# Patient Record
Sex: Male | Born: 1955 | Race: White | Hispanic: No | Smoking: Never smoker
Health system: Southern US, Community
[De-identification: ages and names within clinical notes are randomized; demographics above are authoritative.]

## PROBLEM LIST (undated history)

## (undated) DIAGNOSIS — I10 Essential (primary) hypertension: Secondary | ICD-10-CM

## (undated) HISTORY — PX: OTHER SURGICAL HISTORY: SHX169

## (undated) HISTORY — PX: TONSILLECTOMY: SUR1361

---

## 2013-10-08 HISTORY — PX: INCISION AND DRAINAGE: SHX5863

## 2014-04-12 ENCOUNTER — Other Ambulatory Visit: Payer: Self-pay | Admitting: Orthopedic Surgery

## 2014-04-12 DIAGNOSIS — M546 Pain in thoracic spine: Secondary | ICD-10-CM

## 2014-04-14 ENCOUNTER — Other Ambulatory Visit: Payer: Self-pay | Admitting: Surgical

## 2014-04-14 ENCOUNTER — Ambulatory Visit
Admission: RE | Admit: 2014-04-14 | Discharge: 2014-04-14 | Disposition: A | Discharge status: Home / Self Care | Payer: BC Managed Care – PPO | Source: Ambulatory Visit | Attending: Orthopedic Surgery | Admitting: Orthopedic Surgery

## 2014-04-14 ENCOUNTER — Emergency Department (HOSPITAL_COMMUNITY): Payer: BC Managed Care – PPO

## 2014-04-14 ENCOUNTER — Other Ambulatory Visit: Payer: Self-pay | Admitting: Orthopedic Surgery

## 2014-04-14 ENCOUNTER — Inpatient Hospital Stay (HOSPITAL_COMMUNITY)
Admission: EM | Admit: 2014-04-14 | Discharge: 2014-04-21 | DRG: 603 | Disposition: A | Discharge status: Home / Self Care | Payer: BC Managed Care – PPO | Attending: Orthopedic Surgery | Admitting: Orthopedic Surgery

## 2014-04-14 ENCOUNTER — Encounter (HOSPITAL_COMMUNITY): Payer: Self-pay | Admitting: Emergency Medicine

## 2014-04-14 DIAGNOSIS — M79674 Pain in right toe(s): Secondary | ICD-10-CM

## 2014-04-14 DIAGNOSIS — M79659 Pain in unspecified thigh: Secondary | ICD-10-CM

## 2014-04-14 DIAGNOSIS — L03119 Cellulitis of unspecified part of limb: Principal | ICD-10-CM

## 2014-04-14 DIAGNOSIS — Z113 Encounter for screening for infections with a predominantly sexual mode of transmission: Secondary | ICD-10-CM

## 2014-04-14 DIAGNOSIS — L02415 Cutaneous abscess of right lower limb: Secondary | ICD-10-CM | POA: Diagnosis not present

## 2014-04-14 DIAGNOSIS — L02419 Cutaneous abscess of limb, unspecified: Principal | ICD-10-CM | POA: Diagnosis present

## 2014-04-14 DIAGNOSIS — A4902 Methicillin resistant Staphylococcus aureus infection, unspecified site: Secondary | ICD-10-CM | POA: Diagnosis present

## 2014-04-14 DIAGNOSIS — Z6831 Body mass index (BMI) 31.0-31.9, adult: Secondary | ICD-10-CM

## 2014-04-14 DIAGNOSIS — Z79899 Other long term (current) drug therapy: Secondary | ICD-10-CM

## 2014-04-14 DIAGNOSIS — K5900 Constipation, unspecified: Secondary | ICD-10-CM | POA: Diagnosis present

## 2014-04-14 DIAGNOSIS — L02416 Cutaneous abscess of left lower limb: Secondary | ICD-10-CM | POA: Diagnosis present

## 2014-04-14 DIAGNOSIS — Z7982 Long term (current) use of aspirin: Secondary | ICD-10-CM

## 2014-04-14 DIAGNOSIS — M60009 Infective myositis, unspecified site: Secondary | ICD-10-CM

## 2014-04-14 DIAGNOSIS — M546 Pain in thoracic spine: Secondary | ICD-10-CM

## 2014-04-14 LAB — CBC WITH DIFFERENTIAL/PLATELET
Basophils Absolute: 0 10*3/uL (ref 0.0–0.1)
Basophils Relative: 0 % (ref 0–1)
Eosinophils Absolute: 0.2 10*3/uL (ref 0.0–0.7)
Eosinophils Relative: 2 % (ref 0–5)
HCT: 40.8 % (ref 39.0–52.0)
Hemoglobin: 14.1 g/dL (ref 13.0–17.0)
Lymphocytes Relative: 12 % (ref 12–46)
Lymphs Abs: 1.4 10*3/uL (ref 0.7–4.0)
MCH: 33.1 pg (ref 26.0–34.0)
MCHC: 34.6 g/dL (ref 30.0–36.0)
MCV: 95.8 fL (ref 78.0–100.0)
Monocytes Absolute: 1.1 10*3/uL — ABNORMAL HIGH (ref 0.1–1.0)
Monocytes Relative: 10 % (ref 3–12)
Neutro Abs: 8.7 10*3/uL — ABNORMAL HIGH (ref 1.7–7.7)
Neutrophils Relative %: 76 % (ref 43–77)
Platelets: 308 10*3/uL (ref 150–400)
RBC: 4.26 MIL/uL (ref 4.22–5.81)
RDW: 14.1 % (ref 11.5–15.5)
WBC: 11.4 10*3/uL — ABNORMAL HIGH (ref 4.0–10.5)

## 2014-04-14 LAB — BASIC METABOLIC PANEL
Anion gap: 13 (ref 5–15)
BUN: 17 mg/dL (ref 6–23)
CO2: 27 mEq/L (ref 19–32)
Calcium: 8.9 mg/dL (ref 8.4–10.5)
Chloride: 92 mEq/L — ABNORMAL LOW (ref 96–112)
Creatinine, Ser: 0.85 mg/dL (ref 0.50–1.35)
GFR calc Af Amer: >90 mL/min (ref >90)
GFR calc non Af Amer: >90 mL/min (ref >90)
Glucose, Bld: 110 mg/dL — ABNORMAL HIGH (ref 70–99)
Potassium: 4 mEq/L (ref 3.7–5.3)
Sodium: 132 mEq/L — ABNORMAL LOW (ref 137–147)

## 2014-04-14 LAB — SURGICAL PCR SCREEN
MRSA, PCR: POSITIVE — AB
STAPHYLOCOCCUS AUREUS: POSITIVE — AB

## 2014-04-14 LAB — SEDIMENTATION RATE: Sed Rate: 65 mm/hr — ABNORMAL HIGH (ref 0–16)

## 2014-04-14 LAB — APTT: aPTT: 24 seconds (ref 24–37)

## 2014-04-14 LAB — PROTIME-INR
INR: 1.15 (ref 0.00–1.49)
Prothrombin Time: 14.7 seconds (ref 11.6–15.2)

## 2014-04-14 MED ORDER — HYDROMORPHONE HCL 2 MG PO TABS
4.0000 mg | ORAL_TABLET | ORAL | Status: DC | PRN
  Administered 2014-04-14 – 2014-04-17 (×9): 4 mg via ORAL
  Administered 2014-04-17: 2 mg via ORAL
  Administered 2014-04-18 – 2014-04-19 (×7): 4 mg via ORAL
  Administered 2014-04-20: 2 mg via ORAL
  Administered 2014-04-20 (×2): 4 mg via ORAL
  Administered 2014-04-21: 2 mg via ORAL
  Filled 2014-04-14 (×22): qty 2

## 2014-04-14 MED ORDER — ONDANSETRON HCL 4 MG/2ML IJ SOLN: 4.0000 mg | Freq: Four times a day (QID) | INTRAMUSCULAR | Status: DC | PRN

## 2014-04-14 MED ORDER — MORPHINE SULFATE 2 MG/ML IJ SOLN
1.0000 mg | INTRAMUSCULAR | Status: DC | PRN
  Administered 2014-04-14 – 2014-04-15 (×3): 2 mg via INTRAVENOUS
  Filled 2014-04-14 (×3): qty 1

## 2014-04-14 MED ORDER — SODIUM CHLORIDE 0.9 % IJ SOLN
3.0000 mL | Freq: Two times a day (BID) | INTRAMUSCULAR | Status: DC
  Administered 2014-04-16 – 2014-04-20 (×4): 3 mL via INTRAVENOUS

## 2014-04-14 MED ORDER — CHLORHEXIDINE GLUCONATE CLOTH 2 % EX PADS
6.0000 | MEDICATED_PAD | Freq: Every day | CUTANEOUS | Status: AC
  Administered 2014-04-15 – 2014-04-19 (×5): 6 via TOPICAL

## 2014-04-14 MED ORDER — ALUM & MAG HYDROXIDE-SIMETH 200-200-20 MG/5ML PO SUSP
30.0000 mL | Freq: Four times a day (QID) | ORAL | Status: DC | PRN
  Filled 2014-04-14: qty 30

## 2014-04-14 MED ORDER — CYCLOBENZAPRINE HCL 10 MG PO TABS
10.0000 mg | ORAL_TABLET | Freq: Three times a day (TID) | ORAL | Status: DC | PRN
  Administered 2014-04-14 – 2014-04-20 (×11): 10 mg via ORAL
  Filled 2014-04-14 (×11): qty 1

## 2014-04-14 MED ORDER — SODIUM CHLORIDE 0.9 % IV SOLN: 250.0000 mL | INTRAVENOUS | Status: DC | PRN

## 2014-04-14 MED ORDER — PREDNISONE 5 MG PO TABS: 5.0000 mg | ORAL_TABLET | Freq: Every day | ORAL | Status: DC

## 2014-04-14 MED ORDER — PREDNISONE 5 MG PO TABS
15.0000 mg | ORAL_TABLET | Freq: Every day | ORAL | Status: AC
  Administered 2014-04-15 – 2014-04-16 (×2): 15 mg via ORAL
  Filled 2014-04-14 (×2): qty 1

## 2014-04-14 MED ORDER — DOCUSATE SODIUM 100 MG PO CAPS
100.0000 mg | ORAL_CAPSULE | Freq: Two times a day (BID) | ORAL | Status: DC
Start: 2014-04-14 — End: 2014-04-21
  Administered 2014-04-15 – 2014-04-21 (×12): 100 mg via ORAL

## 2014-04-14 MED ORDER — POLYETHYLENE GLYCOL 3350 17 G PO PACK
17.0000 g | PACK | Freq: Every day | ORAL | Status: DC | PRN
  Administered 2014-04-16 – 2014-04-17 (×2): 17 g via ORAL

## 2014-04-14 MED ORDER — ONDANSETRON HCL 4 MG PO TABS: 4.0000 mg | ORAL_TABLET | Freq: Four times a day (QID) | ORAL | Status: DC | PRN

## 2014-04-14 MED ORDER — SODIUM CHLORIDE 0.9 % IJ SOLN: 3.0000 mL | INTRAMUSCULAR | Status: DC | PRN

## 2014-04-14 MED ORDER — MUPIROCIN 2 % EX OINT
1.0000 "application " | TOPICAL_OINTMENT | Freq: Two times a day (BID) | CUTANEOUS | Status: AC
  Administered 2014-04-15 – 2014-04-19 (×9): 1 via NASAL
  Filled 2014-04-14: qty 22

## 2014-04-14 MED ORDER — BISACODYL 10 MG RE SUPP
10.0000 mg | Freq: Every day | RECTAL | Status: DC | PRN
  Administered 2014-04-18: 10 mg via RECTAL
  Filled 2014-04-14: qty 1

## 2014-04-14 MED ORDER — ACETAMINOPHEN 650 MG RE SUPP: 650.0000 mg | Freq: Four times a day (QID) | RECTAL | Status: DC | PRN

## 2014-04-14 MED ORDER — PREDNISONE 10 MG PO TABS
10.0000 mg | ORAL_TABLET | Freq: Every day | ORAL | Status: DC
  Administered 2014-04-17: 10 mg via ORAL
  Filled 2014-04-14 (×2): qty 1

## 2014-04-14 MED ORDER — IOHEXOL 300 MG/ML  SOLN
125.0000 mL | Freq: Once | INTRAMUSCULAR | Status: AC | PRN
  Administered 2014-04-14: 125 mL via INTRAVENOUS

## 2014-04-14 MED ORDER — ACETAMINOPHEN 325 MG PO TABS: 650.0000 mg | ORAL_TABLET | Freq: Four times a day (QID) | ORAL | Status: DC | PRN

## 2014-04-14 MED ORDER — PREDNISONE 5 MG PO TABS: 5.0000 mg | ORAL_TABLET | ORAL | Status: DC

## 2014-04-14 MED ORDER — LACTATED RINGERS IV SOLN
INTRAVENOUS | Status: DC
  Administered 2014-04-14: 75 mL/h via INTRAVENOUS
  Administered 2014-04-15 – 2014-04-17 (×4): via INTRAVENOUS
  Administered 2014-04-18: 1000 mL via INTRAVENOUS
  Administered 2014-04-20: 07:00:00 via INTRAVENOUS

## 2014-04-14 MED ORDER — FLEET ENEMA 7-19 GM/118ML RE ENEM: 1.0000 | ENEMA | Freq: Once | RECTAL | Status: AC | PRN

## 2014-04-14 NOTE — Progress Notes (Signed)
Subjective: Pain and swelling of Right Great Toe.   Objective: Vital signs in last 24 hours: Temp:  [98.3 F (36.8 C)] 98.3 F (36.8 C) (07/08 1507) Pulse Rate:  [88] 88 (07/08 1507) Resp:  [16] 16 (07/08 1507) BP: (122)/(82) 122/82 mmHg (07/08 1507) SpO2:  [95 %] 95 % (07/08 1507)  Intake/Output from previous day:   Intake/Output this shift:    No results found for this basename: HGB,  in the last 72 hours No results found for this basename: WBC, RBC, HCT, PLT,  in the last 72 hours No results found for this basename: NA, K, CL, CO2, BUN, CREATININE, GLUCOSE, CALCIUM,  in the last 72 hours No results found for this basename: LABPT, INR,  in the last 72 hours  Pain and swelling of his Right Great toe.  Assessment/Plan: Admit to hospital for Aspiration of his Left Thigh.   Atalie Oros A 04/14/2014, 4:45 PM

## 2014-04-14 NOTE — ED Notes (Signed)
Initial contact - pt reports told to come to ER after imaging completed this AM.  C/o pain to RLE.  +swelling/reddness noted.  Skin otherwise PWD.  MAEI.  A+Ox4.  Speaking full/clear sentences.  NAD.

## 2014-04-14 NOTE — ED Notes (Signed)
Surgery at bedside for I&D of toe swelling.

## 2014-04-14 NOTE — ED Notes (Signed)
Attempted to call report to floor, RN unavailable at this time.  

## 2014-04-14 NOTE — ED Notes (Signed)
Pt to radiology.

## 2014-04-14 NOTE — H&P (Signed)
Nicholas [name] is an 58 y.o. male.   Chief Complaint: Pain over inner Left Thigh HPI: He ride a bike 30 miles a day and about one week ago he noted pain in his Left Thigh which has become progressively worse. He has never had a fever. Hih=s Lumbar and Thoracic [name] MRIs have not explained his problem. I ordered a CT Scan of his Pelvis and Report showed a Fluid collection in his Adductor Muscle.No pain in his Left Hip. He also has a peculiar appearance of his opposite Right Great Toe.  History reviewed. No pertinent past medical history.  Past Surgical History  Procedure Laterality Date  . Tonsillectomy      History reviewed. No pertinent family history. Social History:  reports that he has never smoked. He does not have any smokeless tobacco history on file. He reports that he drinks alcohol. He reports that he does not use illicit drugs.  Allergies:  Allergies  Allergen Reactions  . Penicillins     "childhood reaction"     (Not in a hospital admission)  No results found for this or any previous visit (from the past 48 hour(s)). Ct Pelvis W Contrast  04/14/2014   ADDENDUM REPORT: 04/14/2014 13:56  ADDENDUM: Critical Value/emergent results were called by telephone at the time of interpretation on 04/14/2014 at 1:56 PM to Nicholas [name] , who verbally acknowledged these results.   Electronically Signed   By: Nicholas [name] M.D.   On: 04/14/2014 13:56   04/14/2014   CLINICAL DATA:  Thigh pain.  EXAM: CT PELVIS WITH CONTRAST  TECHNIQUE: Multidetector CT imaging of the pelvis was performed using the standard protocol following the bolus administration of intravenous contrast.  CONTRAST:  163mL OMNIPAQUE IOHEXOL 300 MG/ML  SOLN  COMPARISON:  None.  FINDINGS: No significant osseous abnormality is noted. Urinary bladder appears normal. No intraperitoneal fluid collection is noted. Visualized portion of the appendix appears normal. 12.9 x 5.2 x 3.9 cm peripherally enhancing fluid collection is noted  beginning medially and along the course of the left adductor longus muscle. Small amount of gas is noted in the superior portion of this abnormality. Stranding of the overlying subcutaneous tissues is noted concerning for possible cellulitis or edema. Mildly enlarged lymph nodes are noted in the left external iliac region, all of which are less than 1 cm in minor axis. These most likely are inflammatory in origin.  IMPRESSION: Peripherally enhancing fluid collection measuring 12.9 x 5.2 x 3.9 cm is noted along the medial portion of the left adductor longus muscle in the proximal left thigh and inguinal region. The contain soft tissue gas and has stranding of the overlying subcutaneous tissues and is concerning for probable abscess. However, hematoma cannot be excluded but felt much less likely. Probable mild inflammatory adenopathy seen in left external iliac region. I contacted Nicholas [name] office, but he is currently in surgery and they will contact him. These results will be called to the ordering clinician or representative by the Radiologist Assistant, and communication documented in the PACS or zVision Dashboard.  Electronically Signed: By: Nicholas [name] M.D. On: 04/14/2014 13:47   Nicholas [name] Wo Contrast  04/14/2014   CLINICAL DATA:  Bilateral leg pain  EXAM: MRI THORACIC [name] WITHOUT CONTRAST  TECHNIQUE: Multiplanar, multisequence Nicholas imaging of the thoracic [name] was performed. No intravenous contrast was administered.  COMPARISON:  None.  FINDINGS: The vertebral body heights are maintained. The alignment is anatomic. The marrow signal is normal. There is  no acute fracture or static listhesis. The thoracic spinal cord is normal in size and signal.  The disc spaces are relatively well maintained. There is no central canal or foraminal stenosis. There is a mild broad-based disc bulge at T2-3. There is a minimal broad-based disc bulge at T3-4. There is a small right paracentral disc protrusion T7-8  with mild deformity of the right ventral paracentral thoracic spinal cord. There is a small left paracentral disc protrusion at T8-9. There is a right paracentral small disc protrusion T9-10 abutting the ventral thoracic spinal cord. There is a small right paracentral disc protrusion T11-12 abutting the ventral cervical spinal cord.  IMPRESSION: 1. Small right paracentral disc protrusion T7-8 with mild deformity of the right ventral paracentral thoracic spinal cord. 2. Small left paracentral disc protrusion at T8-9. 3. Right paracentral small disc protrusion T9-10 abutting the ventral thoracic spinal cord. 4. Small right paracentral disc protrusion T11-12 abutting the ventral cervical spinal cord.   Electronically Signed   By: Nicholas [name]   On: 04/14/2014 09:59    Review of Systems  Constitutional: Negative.   HENT: Negative.   Eyes: Negative.   Respiratory: Negative.   Cardiovascular: Negative.   Gastrointestinal: Negative.   Genitourinary: Negative.   Musculoskeletal:       Pain and swelling along the Left Adductor Muscle group.No pain with hip motion.  Skin:       Erythemia Left Thigh  Neurological: Negative.   Endo/Heme/Allergies: Negative.   Psychiatric/Behavioral: Negative.     Blood pressure 122/82, pulse 88, temperature 98.3 F (36.8 C), temperature source Oral, resp. rate 16, SpO2 95.00%. Physical Exam  Constitutional: He appears well-developed.  HENT:  Head: Normocephalic.  Eyes: Pupils are equal, round, and reactive to light.  Neck: Normal range of motion.  Cardiovascular: Normal rate.   Respiratory: Effort normal.  GI: Soft.  Genitourinary: Penis normal.  Musculoskeletal:  Pain over Left Adductor muscle group with Erythema.  Skin: Skin is warm. There is erythema.     Assessment/Plan Xray Right Foot . Admit for Aspiration of Adductor Muscle by Radiologist and evaluation by Infectious Disease.  Nicholas [name] A 04/14/2014, 4:02 PM

## 2014-04-14 NOTE — ED Provider Notes (Signed)
1600 - Patient here under care of Dr. Gladstone Lighter. Here for L upper leg cellulitis/fluid collection, R toe swelling. Dr. Gladstone Lighter admitting.  Osvaldo Shipper, MD 04/14/14 [phone]

## 2014-04-14 NOTE — ED Notes (Addendum)
Pt, sent by [facility] MD, c/o L upper leg pain x 9 days and R great toe pain/possible infection x 2 days.  Pain score 4/10.  Redness, swelling, and warmth noted to both areas.  Pt had MRI and CT earlier and was told that he has an abscess or hematoma on L side pelvis that needs to be aspirated.

## 2014-04-14 NOTE — ED Notes (Signed)
Surgery at bedside for eval

## 2014-04-15 ENCOUNTER — Encounter (HOSPITAL_COMMUNITY): Payer: Self-pay | Admitting: *Deleted

## 2014-04-15 ENCOUNTER — Observation Stay (HOSPITAL_COMMUNITY): Payer: BC Managed Care – PPO

## 2014-04-15 DIAGNOSIS — L03119 Cellulitis of unspecified part of limb: Secondary | ICD-10-CM

## 2014-04-15 DIAGNOSIS — L02419 Cutaneous abscess of limb, unspecified: Secondary | ICD-10-CM

## 2014-04-15 LAB — GRAM STAIN

## 2014-04-15 LAB — CBC WITH DIFFERENTIAL/PLATELET
Basophils Absolute: 0 10*3/uL (ref 0.0–0.1)
Basophils Relative: 0 % (ref 0–1)
Eosinophils Absolute: 0.2 10*3/uL (ref 0.0–0.7)
Eosinophils Relative: 2 % (ref 0–5)
HCT: 39.8 % (ref 39.0–52.0)
Hemoglobin: 13.5 g/dL (ref 13.0–17.0)
Lymphocytes Relative: 10 % — ABNORMAL LOW (ref 12–46)
Lymphs Abs: 1.1 10*3/uL (ref 0.7–4.0)
MCH: 32.8 pg (ref 26.0–34.0)
MCHC: 33.9 g/dL (ref 30.0–36.0)
MCV: 96.8 fL (ref 78.0–100.0)
Monocytes Absolute: 1.4 10*3/uL — ABNORMAL HIGH (ref 0.1–1.0)
Monocytes Relative: 12 % (ref 3–12)
Neutro Abs: 8.4 10*3/uL — ABNORMAL HIGH (ref 1.7–7.7)
Neutrophils Relative %: 76 % (ref 43–77)
Platelets: 319 10*3/uL (ref 150–400)
RBC: 4.11 MIL/uL — ABNORMAL LOW (ref 4.22–5.81)
RDW: 14.3 % (ref 11.5–15.5)
WBC: 11 10*3/uL — ABNORMAL HIGH (ref 4.0–10.5)

## 2014-04-15 LAB — C-REACTIVE PROTEIN: CRP: 17 mg/dL — ABNORMAL HIGH (ref <0.60)

## 2014-04-15 MED ORDER — LINEZOLID 600 MG PO TABS
600.0000 mg | ORAL_TABLET | Freq: Two times a day (BID) | ORAL | Status: DC
  Administered 2014-04-15: 600 mg via ORAL
  Filled 2014-04-15 (×3): qty 1

## 2014-04-15 MED ORDER — FENTANYL CITRATE 0.05 MG/ML IJ SOLN
INTRAMUSCULAR | Status: AC | PRN
  Administered 2014-04-15: 50 ug via INTRAVENOUS

## 2014-04-15 MED ORDER — VANCOMYCIN HCL 10 G IV SOLR
2250.0000 mg | Freq: Once | INTRAVENOUS | Status: AC
  Administered 2014-04-15: 2250 mg via INTRAVENOUS
  Filled 2014-04-15 (×2): qty 2250

## 2014-04-15 MED ORDER — CLINDAMYCIN PHOSPHATE 600 MG/50ML IV SOLN
600.0000 mg | Freq: Four times a day (QID) | INTRAVENOUS | Status: DC
  Filled 2014-04-15: qty 50

## 2014-04-15 MED ORDER — MIDAZOLAM HCL 2 MG/2ML IJ SOLN
INTRAMUSCULAR | Status: AC | PRN
  Administered 2014-04-15 (×4): 1 mg via INTRAVENOUS

## 2014-04-15 MED ORDER — PIPERACILLIN-TAZOBACTAM 3.375 G IVPB
3.3750 g | Freq: Three times a day (TID) | INTRAVENOUS | Status: DC
  Filled 2014-04-15: qty 50

## 2014-04-15 MED ORDER — MIDAZOLAM HCL 2 MG/2ML IJ SOLN
INTRAMUSCULAR | Status: AC
Start: 2014-04-15 — End: 2014-04-16
  Filled 2014-04-15: qty 6

## 2014-04-15 MED ORDER — VANCOMYCIN HCL IN DEXTROSE 1-5 GM/200ML-% IV SOLN
1000.0000 mg | Freq: Two times a day (BID) | INTRAVENOUS | Status: DC
  Administered 2014-04-16: 1000 mg via INTRAVENOUS
  Filled 2014-04-15: qty 200

## 2014-04-15 MED ORDER — FENTANYL CITRATE 0.05 MG/ML IJ SOLN
INTRAMUSCULAR | Status: AC
  Filled 2014-04-15: qty 6

## 2014-04-15 NOTE — Progress Notes (Signed)
Agree.  For drainage of left thigh collection/abscess later today.

## 2014-04-15 NOTE — Consult Note (Signed)
Came to see patient regarding abscess. Currently in IR for drainage.  Has been off of antibiotics appropriately awaiting culture.  Will start vancomycin after drainage and await culture.   Will follow up in am.    Scharlene Gloss, MD

## 2014-04-15 NOTE — Progress Notes (Addendum)
ANTIBIOTIC CONSULT NOTE - INITIAL  Pharmacy Consult for Vancomycin Indication: Abscess  Allergies  Allergen Reactions  . Penicillins     "childhood reaction"    Patient Measurements:   Height: 72 inches Weight: 106.8 kg  Vital Signs: Temp: 99 F (37.2 C) (07/09 0549) Temp src: Oral (07/09 0549) BP: 126/86 mmHg (07/09 1503) Pulse Rate: 87 (07/09 1503) Intake/Output from previous day: 07/08 0701 - 07/09 0700 In: 1296.3 [P.O.:480; I.V.:816.3] Out: 1850 [Urine:1850] Intake/Output from this shift: Total I/O In: 0  Out: 2100 [Urine:2100]  Labs:  Recent Labs  04/14/14 1623 04/14/14 2008 04/15/14 0438  WBC  --  11.4* 11.0*  HGB  --  14.1 13.5  PLT  --  308 319  CREATININE 0.85  --   --    CrCl is unknown because there is no height on file for the current visit. 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  Test performance has     been validated by [employer] for patients greater     than or equal to 30 year old.     It is not intended     to diagnose infection nor to     guide or monitor treatment.    Medical History: History reviewed. No pertinent past medical history.  Medications:  Scheduled:  .  Chlorhexidine Gluconate Cloth  6 each Topical Q0600  . docusate sodium  100 mg Oral BID  . fentaNYL      . midazolam      . mupirocin ointment  1 application Nasal BID  . predniSONE  15 mg Oral Q breakfast   Followed by  . [START ON 04/17/2014] predniSONE  10 mg Oral Q breakfast   Followed by  . [START ON 04/19/2014] predniSONE  5 mg Oral Q breakfast  . sodium chloride  3 mL Intravenous Q12H   Infusions:  . lactated ringers 75 mL/hr at 04/15/14 0809   PRN: sodium chloride, acetaminophen, acetaminophen, alum & mag hydroxide-simeth, bisacodyl, cyclobenzaprine, HYDROmorphone, morphine injection, ondansetron (ZOFRAN) IV, ondansetron, polyethylene glycol, sodium chloride  Assessment: 58 yo male with a fluid collection in adductor muscle. Had drainage of abscess in IR today, grossly purulent fluid removed. ID is following. Now starting vanc per pharmacy.   Goal of Therapy:  Vancomycin trough level 15-20 mcg/ml  Plan:   Vancomycin 2250mg  IV x 1, then 1g IV q12h Check trough at steady state Follow up renal function & cultures  Peggyann Juba, PharmD, BCPS Pager: [phone] 04/15/2014,3:21 PM   Addendum: After C[facility] evaluation, now asked  to dose Zosyn for necrotizing fasciitis coverage (MD dosing clindamycin).  Plan: Zosyn 3.375gm IV q8h (4hr extended infusions)  Peggyann Juba, PharmD, BCPS 04/15/2014 6:47 PM

## 2014-04-15 NOTE — Procedures (Signed)
Procedure:  Ultrasound guided drainage of left thigh abscess Findings:  Grossly purulent fluid.  Sample sent for culture. 12 Fr percutaneous drain placed and attached to suction bulb.  Will follow.

## 2014-04-15 NOTE — Progress Notes (Signed)
Patient ID: Nicholas [name], male   DOB: 24-Mar-1956, 58 y.o.   MRN: [id number] Request received from Dr. Gladstone Lighter for US guided aspiration/possible drainage of fluid collection in medial left thigh/adductor longus muscle region concerning for abscess. Imaging studies were reviewed by Dr. Kathlene Cote. Additional PMH as below. Exam: pt awake/alert; chest- CTA bilat; heart- RRR; abd- soft,+BS, mild lower pelvic tenderness; area of erythema/tenderness/induration medial left upper thigh; left >right LE edema; rt foot covered with gauze bandage (had I&D great toe).   Filed Vitals:   04/14/14 1900 04/14/14 2041 04/14/14 2217 04/15/14 0549  BP: 164/95  133/83 124/80  Pulse:   87 91  Temp: 98.7 F (37.1 C)  100.3 F (37.9 C) 99 F (37.2 C)  TempSrc: Oral  Oral Oral  Resp: 18  18 18   SpO2: 100% 100% 94% 91%  History reviewed. No pertinent past medical history. Past Surgical History  Procedure Laterality Date  . Tonsillectomy     Ct Pelvis W Contrast  04/14/2014   ADDENDUM REPORT: 04/14/2014 13:56  ADDENDUM: Critical Value/emergent results were called by telephone at the time of interpretation on 04/14/2014 at 1:56 PM to Dr. Latanya Maudlin , who verbally acknowledged these results.   Electronically Signed   By: Sabino Dick M.D.   On: 04/14/2014 13:56   04/14/2014   CLINICAL DATA:  Thigh pain.  EXAM: CT PELVIS WITH CONTRAST  TECHNIQUE: Multidetector CT imaging of the pelvis was performed using the standard protocol following the bolus administration of intravenous contrast.  CONTRAST:  123mL OMNIPAQUE IOHEXOL 300 MG/ML  SOLN  COMPARISON:  None.  FINDINGS: No significant osseous abnormality is noted. Urinary bladder appears normal. No intraperitoneal fluid collection is noted. Visualized portion of the appendix appears normal. 12.9 x 5.2 x 3.9 cm peripherally enhancing fluid collection is noted beginning medially and along the course of the left adductor longus muscle. Small amount of gas is noted in the superior portion  of this abnormality. Stranding of the overlying subcutaneous tissues is noted concerning for possible cellulitis or edema. Mildly enlarged lymph nodes are noted in the left external iliac region, all of which are less than 1 cm in minor axis. These most likely are inflammatory in origin.  IMPRESSION: Peripherally enhancing fluid collection measuring 12.9 x 5.2 x 3.9 cm is noted along the medial portion of the left adductor longus muscle in the proximal left thigh and inguinal region. The contain soft tissue gas and has stranding of the overlying subcutaneous tissues and is concerning for probable abscess. However, hematoma cannot be excluded but felt much less likely. Probable mild inflammatory adenopathy seen in left external iliac region. I contacted Dr. Charlestine Night office, but he is currently in surgery and they will contact him. These results will be called to the ordering clinician or representative by the Radiologist Assistant, and communication documented in the PACS or zVision Dashboard.  Electronically Signed: By: Sabino Dick M.D. On: 04/14/2014 13:47   [name]  04/14/2014   CLINICAL DATA:  Bilateral leg pain  EXAM: MRI THORACIC SPINE WITHOUT CONTRAST  TECHNIQUE: Multiplanar, multisequence MR imaging of the thoracic spine was performed. No intravenous contrast was administered.  COMPARISON:  None.  FINDINGS: The vertebral body heights are maintained. The alignment is anatomic. The marrow signal is normal. There is no acute fracture or static listhesis. The thoracic spinal cord is normal in size and signal.  The disc spaces are relatively well maintained. There is no central canal or foraminal stenosis. There  is a mild broad-based disc bulge at T2-3. There is a minimal broad-based disc bulge at T3-4. There is a small right paracentral disc protrusion T7-8 with mild deformity of the right ventral paracentral thoracic spinal cord. There is a small left paracentral disc protrusion at  T8-9. There is a right paracentral small disc protrusion T9-10 abutting the ventral thoracic spinal cord. There is a small right paracentral disc protrusion T11-12 abutting the ventral cervical spinal cord.  IMPRESSION: 1. Small right paracentral disc protrusion T7-8 with mild deformity of the right ventral paracentral thoracic spinal cord. 2. Small left paracentral disc protrusion at T8-9. 3. Right paracentral small disc protrusion T9-10 abutting the ventral thoracic spinal cord. 4. Small right paracentral disc protrusion T11-12 abutting the ventral cervical spinal cord.   Electronically Signed   By: Kathreen Devoid   On: 04/14/2014 09:59   Dg Foot Complete Right  04/14/2014   CLINICAL DATA:  Pain and swelling ; erythema  EXAM: RIGHT FOOT COMPLETE - 3+ VIEW  COMPARISON:  None.  FINDINGS: Frontal, oblique, and lateral views were obtained. There is no fracture or dislocation. 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 Other joint spaces appear unremarkable. There is no erosive change or bony destruction. There is no soft tissue calcification. There is some mild soft tissue swelling over the dorsum of the forefoot.  IMPRESSION: Soft tissue swelling over the dorsum of the forefoot. No bony destruction or erosion. 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 No fracture or dislocation.   Electronically Signed   By: Lowella Grip M.D.   On: 04/14/2014 16:23  Results for orders placed during the hospital encounter of 04/14/14  SURGICAL PCR SCREEN      Result Value Ref Range   MRSA, PCR POSITIVE (*) NEGATIVE   Staphylococcus aureus POSITIVE (*) NEGATIVE  BASIC METABOLIC PANEL      Result Value Ref Range   Sodium 132 (*) 137 - 147 mEq/L   Potassium 4.0  3.7 - 5.3 mEq/L   Chloride 92 (*) 96 - 112 mEq/L   CO2 27  19 - 32 mEq/L   Glucose, Bld 110 (*) 70 - 99 mg/dL   BUN 17  6 - 23 mg/dL   Creatinine, Ser 0.85  0.50 - 1.35 mg/dL   Calcium 8.9  8.4 - 10.5 mg/dL   GFR calc non Af Amer >90   >90 mL/min   GFR calc Af Amer >90  >90 mL/min   Anion gap 13  5 - 15  CBC WITH DIFFERENTIAL      Result Value Ref Range   WBC 11.0 (*) 4.0 - 10.5 K/uL   RBC 4.11 (*) 4.22 - 5.81 MIL/uL   Hemoglobin 13.5  13.0 - 17.0 g/dL   HCT 39.8  39.0 - 52.0 %   MCV 96.8  78.0 - 100.0 fL   MCH 32.8  26.0 - 34.0 pg   MCHC 33.9  30.0 - 36.0 g/dL   RDW 14.3  11.5 - 15.5 %   Platelets 319  150 - 400 K/uL   Neutrophils Relative % 76  43 - 77 %   Neutro Abs 8.4 (*) 1.7 - 7.7 K/uL   Lymphocytes Relative 10 (*) 12 - 46 %   Lymphs Abs 1.1  0.7 - 4.0 K/uL   Monocytes Relative 12  3 - 12 %   Monocytes Absolute 1.4 (*) 0.1 - 1.0 K/uL   Eosinophils Relative 2  0 - 5 %  Eosinophils Absolute 0.2  0.0 - 0.7 K/uL   Basophils Relative 0  0 - 1 %   Basophils Absolute 0.0  0.0 - 0.1 K/uL  CBC WITH DIFFERENTIAL      Result Value Ref Range   WBC 11.4 (*) 4.0 - 10.5 K/uL   RBC 4.26  4.22 - 5.81 MIL/uL   Hemoglobin 14.1  13.0 - 17.0 g/dL   HCT 40.8  39.0 - 52.0 %   MCV 95.8  78.0 - 100.0 fL   MCH 33.1  26.0 - 34.0 pg   MCHC 34.6  30.0 - 36.0 g/dL   RDW 14.1  11.5 - 15.5 %   Platelets 308  150 - 400 K/uL   Neutrophils Relative % 76  43 - 77 %   Neutro Abs 8.7 (*) 1.7 - 7.7 K/uL   Lymphocytes Relative 12  12 - 46 %   Lymphs Abs 1.4  0.7 - 4.0 K/uL   Monocytes Relative 10  3 - 12 %   Monocytes Absolute 1.1 (*) 0.1 - 1.0 K/uL   Eosinophils Relative 2  0 - 5 %   Eosinophils Absolute 0.2  0.0 - 0.7 K/uL   Basophils Relative 0  0 - 1 %   Basophils Absolute 0.0  0.0 - 0.1 K/uL  APTT      Result Value Ref Range   aPTT 24  24 - 37 seconds  PROTIME-INR      Result Value Ref Range   Prothrombin Time 14.7  11.6 - 15.2 seconds   INR 1.15  0.00 - 1.49  C-REACTIVE PROTEIN      Result Value Ref Range   CRP 17.0 (*) <0.60 mg/dL  SEDIMENTATION RATE      Result Value Ref Range   Sed Rate 65 (*) 0 - 16 mm/hr   A/P: Pt with hx of left thigh pain, mild leukocytosis/temp elevation and recent finding of fluid  collection in medial left thigh/adductor longus muscle region concerning for abscess. Plan is for US guided aspiration/possible drainage of left thigh fluid collection today. Details/risks of procedure d/w pt with his understanding and consent.

## 2014-04-15 NOTE — Consult Note (Signed)
Reason for Consult:  Left thigh abscess Referring Physician: Dr. [name] is an 58 y.o. male.  HPI: This is a healthy gentleman who started having pain left thigh about 3 weeks ago.  He took some Aleve and ignored it as much as he could.  Symptoms have become progressively worse to the point he was getting crutches so he could keep all pressure of his leg.  He saw Nicholas [name] and an MRI didn't really show anything that would explain his symptoms.  A CT scan was then obtained 0n 04/14/14.  This showed a 12.9 x 5.2 x 3.9 cm peripherally enhancing fluid collection is noted beginning medially and along the course of the left adductor longus muscle. Small amount of gas is noted in the superior portion of this abnormality.  Stranding of the overlying subcutaneous tissues is noted concerning for possible cellulitis or edema. Mildly enlarged lymph nodes are  noted in the left external iliac region, all of which are less than  1 cm in minor axis. These most likely are inflammatory in origin.  He also complained of pain right medial thigh and swelling and inflammation and swelling of the right great toe.  He underwent drainagae of the great toe on the right by Nicholas [name], and IR drainage of the left thigh fluid collection today.  The thigh had a large amount of purulent drainage and a drain was left in place. All cultures are pending.  He feels better, on the left side.  We are ask to see should he come to open I&D of the thigh.   History reviewed. No pertinent past medical history.  Past Surgical History  Procedure Laterality Date  . Tonsillectomy      History reviewed. No pertinent family history.  Social History:  reports that he has never smoked. He does not have any smokeless tobacco history on file. He reports that he drinks alcohol. He reports that he does not use illicit drugs.  Allergies:  Allergies  Allergen Reactions  . Penicillins     "childhood reaction"    Medications:   Prior to Admission:  Prescriptions prior to admission  Medication Sig Dispense Refill  . aspirin 325 MG tablet Take 325 mg by mouth daily.      . bisacodyl (DULCOLAX) 5 MG EC tablet Take 5 mg by mouth daily as needed for moderate constipation.      . cyclobenzaprine (FLEXERIL) 10 MG tablet Take 10 mg by mouth 3 (three) times daily as needed for muscle spasms.      Marland Kitchen HYDROmorphone (DILAUDID) 4 MG tablet Take 4 mg by mouth every 4 (four) hours as needed for moderate pain or severe pain.      Marland Kitchen ibuprofen (ADVIL,MOTRIN) 200 MG tablet Take 400 mg by mouth 3 (three) times daily as needed for moderate pain.      . naproxen (NAPROSYN) 500 MG tablet Take 500 mg by mouth 2 (two) times daily with a meal.      . predniSONE (DELTASONE) 5 MG tablet Take 5 mg by mouth See admin instructions. Day 1: 6 Tabs. Day 2: 6 tabs. Day 3: 5 tabs. Day 4: 5 tabs Day 5: 4 tabs. Day 6: 4 Tabs. Day 7:3 Tabs. Day 8:3 Tabs. Day 9: 2 tabs. Day 10: 2 tabs. Day 11: 1 Tab. Day 12: 1 tab.       Scheduled: . Chlorhexidine Gluconate Cloth  6 each Topical Q0600  . docusate sodium  100 mg Oral BID  .  fentaNYL      . midazolam      . mupirocin ointment  1 application Nasal BID  . predniSONE  15 mg Oral Q breakfast   Followed by  . [START ON 04/17/2014] predniSONE  10 mg Oral Q breakfast   Followed by  . [START ON 04/19/2014] predniSONE  5 mg Oral Q breakfast  . sodium chloride  3 mL Intravenous Q12H  . vancomycin  2,250 mg Intravenous Once  . [START ON 04/16/2014] vancomycin  1,000 mg Intravenous Q12H   Continuous: . lactated ringers 75 mL/hr at 04/15/14 0809   TDV:VOHYWV chloride, acetaminophen, acetaminophen, alum & mag hydroxide-simeth, bisacodyl, cyclobenzaprine, HYDROmorphone, morphine injection, ondansetron (ZOFRAN) IV, ondansetron, polyethylene glycol, sodium chloride Anti-infectives   Start     Dose/Rate Route Frequency Ordered Stop   04/16/14 0600  vancomycin (VANCOCIN) IVPB 1000 mg/200 mL premix     1,000 mg 200  mL/hr over 60 Minutes Intravenous Every 12 hours 04/15/14 1552     04/15/14 1630  vancomycin (VANCOCIN) 2,250 mg in sodium chloride 0.9 % 500 mL IVPB     2,250 mg 250 mL/hr over 120 Minutes Intravenous  Once 04/15/14 1552        Results for orders placed during the hospital encounter of 04/14/14 (from the past 48 hour(s))  BASIC METABOLIC PANEL     Status: Abnormal   Collection Time    04/14/14  4:23 PM      Result Value Ref Range   Sodium 132 (*) 137 - 147 mEq/L   Potassium 4.0  3.7 - 5.3 mEq/L   Chloride 92 (*) 96 - 112 mEq/L   CO2 27  19 - 32 mEq/L   Glucose, Bld 110 (*) 70 - 99 mg/dL   BUN 17  6 - 23 mg/dL   Creatinine, Ser 0.85  0.50 - 1.35 mg/dL   Calcium 8.9  8.4 - 10.5 mg/dL   GFR calc non Af Amer >90  >90 mL/min   GFR calc Af Amer >90  >90 mL/min   Comment: (NOTE)     The eGFR has been calculated using the CKD EPI equation.     This calculation has not been validated in all clinical situations.     eGFR's persistently <90 mL/min signify possible Chronic Kidney     Disease.   Anion gap 13  5 - 15  C-REACTIVE PROTEIN     Status: Abnormal   Collection Time    04/14/14  8:05 PM      Result Value Ref Range   CRP 17.0 (*) <0.60 mg/dL   Comment: Performed at [place]     Status: Abnormal   Collection Time    04/14/14  8:05 PM      Result Value Ref Range   Sed Rate 65 (*) 0 - 16 mm/hr  CBC WITH DIFFERENTIAL     Status: Abnormal   Collection Time    04/14/14  8:08 PM      Result Value Ref Range   WBC 11.4 (*) 4.0 - 10.5 K/uL   RBC 4.26  4.22 - 5.81 MIL/uL   Hemoglobin 14.1  13.0 - 17.0 g/dL   HCT 40.8  39.0 - 52.0 %   MCV 95.8  78.0 - 100.0 fL   MCH 33.1  26.0 - 34.0 pg   MCHC 34.6  30.0 - 36.0 g/dL   RDW 14.1  11.5 - 15.5 %   Platelets 308  150 - 400  K/uL   Neutrophils Relative % 76  43 - 77 %   Neutro Abs 8.7 (*) 1.7 - 7.7 K/uL   Lymphocytes Relative 12  12 - 46 %   Lymphs Abs 1.4  0.7 - 4.0 K/uL   Monocytes Relative 10  3 - 12  %   Monocytes Absolute 1.1 (*) 0.1 - 1.0 K/uL   Eosinophils Relative 2  0 - 5 %   Eosinophils Absolute 0.2  0.0 - 0.7 K/uL   Basophils Relative 0  0 - 1 %   Basophils Absolute 0.0  0.0 - 0.1 K/uL  APTT     Status: None   Collection Time    04/14/14  8:08 PM      Result Value Ref Range   aPTT 24  24 - 37 seconds  PROTIME-INR     Status: None   Collection Time    04/14/14  8:08 PM      Result Value Ref Range   Prothrombin Time 14.7  11.6 - 15.2 seconds   INR 1.15  0.00 - 1.49  SURGICAL PCR SCREEN     Status: Abnormal   Collection Time    04/14/14  8:36 PM      Result Value Ref Range   MRSA, PCR POSITIVE (*) NEGATIVE   Comment: RESULT CALLED TO, READ BACK BY AND VERIFIED WITH:     J.CRUTCHFIELD,RN AT 2252 ON 04/14/14 BY WSHEA   Staphylococcus aureus POSITIVE (*) NEGATIVE   Comment:            The Xpert SA Assay (FDA     approved for NASAL specimens     in patients over 7 years of age),     is one component of     a comprehensive surveillance     program.  Test performance has     been validated by [employer] for patients greater     than or equal to 38 year old.     It is not intended     to diagnose infection nor to     guide or monitor treatment.  CBC WITH DIFFERENTIAL     Status: Abnormal   Collection Time    04/15/14  4:38 AM      Result Value Ref Range   WBC 11.0 (*) 4.0 - 10.5 K/uL   RBC 4.11 (*) 4.22 - 5.81 MIL/uL   Hemoglobin 13.5  13.0 - 17.0 g/dL   HCT 39.8  39.0 - 52.0 %   MCV 96.8  78.0 - 100.0 fL   MCH 32.8  26.0 - 34.0 pg   MCHC 33.9  30.0 - 36.0 g/dL   RDW 14.3  11.5 - 15.5 %   Platelets 319  150 - 400 K/uL   Neutrophils Relative % 76  43 - 77 %   Neutro Abs 8.4 (*) 1.7 - 7.7 K/uL   Lymphocytes Relative 10 (*) 12 - 46 %   Lymphs Abs 1.1  0.7 - 4.0 K/uL   Monocytes Relative 12  3 - 12 %   Monocytes Absolute 1.4 (*) 0.1 - 1.0 K/uL   Eosinophils Relative 2  0 - 5 %   Eosinophils Absolute 0.2  0.0 - 0.7 K/uL   Basophils Relative 0  0 - 1 %    Basophils Absolute 0.0  0.0 - 0.1 K/uL    Ct Pelvis W Contrast  04/14/2014   ADDENDUM REPORT: 04/14/2014 13:56  ADDENDUM: Critical Value/emergent results  were called by telephone at the time of interpretation on 04/14/2014 at 1:56 PM to Nicholas [name] , who verbally acknowledged these results.   Electronically Signed   By: Sabino Dick M.D.   On: 04/14/2014 13:56   04/14/2014   CLINICAL DATA:  Thigh pain.  EXAM: CT PELVIS WITH CONTRAST  TECHNIQUE: Multidetector CT imaging of the pelvis was performed using the standard protocol following the bolus administration of intravenous contrast.  CONTRAST:  137m OMNIPAQUE IOHEXOL 300 MG/ML  SOLN  COMPARISON:  None.  FINDINGS: No significant osseous abnormality is noted. Urinary bladder appears normal. No intraperitoneal fluid collection is noted. Visualized portion of the appendix appears normal. 12.9 x 5.2 x 3.9 cm peripherally enhancing fluid collection is noted beginning medially and along the course of the left adductor longus muscle. Small amount of gas is noted in the superior portion of this abnormality. Stranding of the overlying subcutaneous tissues is noted concerning for possible cellulitis or edema. Mildly enlarged lymph nodes are noted in the left external iliac region, all of which are less than 1 cm in minor axis. These most likely are inflammatory in origin.  IMPRESSION: Peripherally enhancing fluid collection measuring 12.9 x 5.2 x 3.9 cm is noted along the medial portion of the left adductor longus muscle in the proximal left thigh and inguinal region. The contain soft tissue gas and has stranding of the overlying subcutaneous tissues and is concerning for probable abscess. However, hematoma cannot be excluded but felt much less likely. Probable mild inflammatory adenopathy seen in left external iliac region. I contacted Nicholas [name], but he is currently in surgery and they will contact him. These results will be called to the ordering  clinician or representative by the Radiologist Assistant, and communication documented in the PACS or zVision Dashboard.  Electronically Signed: By: JSabino DickM.D. On: 04/14/2014 13:47   [name]  04/14/2014   CLINICAL DATA:  Bilateral leg pain  EXAM: MRI THORACIC SPINE WITHOUT CONTRAST  TECHNIQUE: Multiplanar, multisequence MR imaging of the thoracic spine was performed. No intravenous contrast was administered.  COMPARISON:  None.  FINDINGS: The vertebral body heights are maintained. The alignment is anatomic. The marrow signal is normal. There is no acute fracture or static listhesis. The thoracic spinal cord is normal in size and signal.  The disc spaces are relatively well maintained. There is no central canal or foraminal stenosis. There is a mild broad-based disc bulge at T2-3. There is a minimal broad-based disc bulge at T3-4. There is a small right paracentral disc protrusion T7-8 with mild deformity of the right ventral paracentral thoracic spinal cord. There is a small left paracentral disc protrusion at T8-9. There is a right paracentral small disc protrusion T9-10 abutting the ventral thoracic spinal cord. There is a small right paracentral disc protrusion T11-12 abutting the ventral cervical spinal cord.  IMPRESSION: 1. Small right paracentral disc protrusion T7-8 with mild deformity of the right ventral paracentral thoracic spinal cord. 2. Small left paracentral disc protrusion at T8-9. 3. Right paracentral small disc protrusion T9-10 abutting the ventral thoracic spinal cord. 4. Small right paracentral disc protrusion T11-12 abutting the ventral cervical spinal cord.   Electronically Signed   By: HKathreen Devoid  On: 04/14/2014 09:59   [place]iration  04/15/2014   CLINICAL DATA:  Left medial thigh fluid collection concerning for abscess by imaging. The patient presents for aspiration and drainage under ultrasound guidance.  EXAM: ULTRASOUND GUIDED PERCUTANEOUS  CATHETER  DRAINAGE OF LEFT THIGH ABSCESS  MEDICATIONS: 4.0 mg IV Versed; 100 mcg IV Fentanyl  Total Moderate Sedation Time: 15 min  PROCEDURE: The procedure, risks, benefits, and alternatives were explained to the patient. Questions regarding the procedure were encouraged and answered. The patient understands and consents to the procedure.  The medial left thigh was prepped with Betadine in a sterile fashion, and a sterile drape was applied covering the operative field. A sterile gown and sterile gloves were used for the procedure. Local anesthesia was provided with 1% Lidocaine.  Ultrasound was performed of the soft tissues of the left medial thigh. After choosing a site for access, an 18 gauge trocar needle was advanced under ultrasound guidance into the collection. Aspiration of fluid was performed and a sample sent for culture analysis. A guidewire was advanced through the needle.  The percutaneous tract was dilated and a 12 French percutaneous drainage catheter placed. Catheter position was confirmed by ultrasound. The catheter was flushed and connected to a suction bulb. It was secured at the skin with a Prolene retention suture and StatLock device.  COMPLICATIONS: None.  FINDINGS: Initial ultrasound shows an elongated, complex fluid collection of the left medial thigh containing mixed echogenicity fluid and tissue. Aspiration yielded grossly purulent fluid. After placement of the drainage catheter, there is very good return of fluid.  IMPRESSION: Ultrasound-guided drainage of left thigh abscess. Aspirated fluid was overtly purulent and a sample sent for culture analysis. A 12 French drain was placed in the collection and connected to suction bulb drainage.   Electronically Signed   By: Aletta Edouard M.D.   On: 04/15/2014 16:12   Dg Foot Complete Right  04/14/2014   CLINICAL DATA:  Pain and swelling ; erythema  EXAM: RIGHT FOOT COMPLETE - 3+ VIEW  COMPARISON:  None.  FINDINGS: Frontal, oblique, and lateral views  were obtained. There is no fracture or dislocation. 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 Other joint spaces appear unremarkable. There is no erosive change or bony destruction. There is no soft tissue calcification. There is some mild soft tissue swelling over the dorsum of the forefoot.  IMPRESSION: Soft tissue swelling over the dorsum of the forefoot. No bony destruction or erosion. 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 No fracture or dislocation.   Electronically Signed   By: Lowella Grip M.D.   On: 04/14/2014 16:23    Review of Systems  Constitutional: Negative.   HENT: Negative.   Eyes: Negative.   Respiratory: Negative.   Cardiovascular: Negative.   Gastrointestinal: Positive for constipation (No BM for a week). Negative for heartburn, nausea, vomiting, abdominal pain, diarrhea, blood in stool and melena.  Genitourinary: Negative.   Musculoskeletal:       He started having achy pain Left medial thigh, which became progressively worse over the next 2 weeks.  This week he got to the point he could not walk on his left leg and was getting crutches.  Skin:       Pain and swelling left leg and then some in the right leg  Neurological: Negative.   Endo/Heme/Allergies: Negative.   Psychiatric/Behavioral: Negative.    Blood pressure 126/86, pulse 87, temperature 99 F (37.2 C), temperature source Oral, resp. rate 18, height 6' (1.829 m), weight 106.595 kg (235 lb), SpO2 96.00%. Physical Exam  Constitutional: He appears well-developed and well-nourished. No distress.  HENT:  Head: Normocephalic and atraumatic.  Nose: Nose normal.  Eyes: Conjunctivae and EOM are  normal. Pupils are equal, round, and reactive to light. Right eye exhibits no discharge. Left eye exhibits no discharge. No scleral icterus.  Neck: Normal range of motion. Neck supple. No JVD present. No tracheal deviation present. No thyromegaly present.  Cardiovascular: Normal rate, regular  rhythm, normal heart sounds and intact distal pulses.  Exam reveals no gallop.   No murmur heard. Respiratory: Effort normal and breath sounds normal. No respiratory distress. He has no wheezes. He has no rales. He exhibits no tenderness.  GI: Soft. Bowel sounds are normal. He exhibits no distension and no mass. There is no tenderness. There is no rebound and no guarding.  Genitourinary:  Foley in place  Musculoskeletal:  He now has a dressing over left upper medial thigh.  After IR placed a drain.  There is purulent fluid in the drain.   He also has a red erythematous right great toe.  He also complains of pain right medial thigh, but there is no erythema around the site he describes.  Lymphadenopathy:    He has no cervical adenopathy.  Neurological: He is alert. No cranial nerve deficit.  Skin: Skin is warm and dry. No rash noted. He is not diaphoretic. There is erythema (right great toe). No pallor.  Psychiatric: He has a normal mood and affect. His behavior is normal. Judgment and thought content normal.    Assessment/Plan: 1.  Large purulent abscess left medial thigh 2.  Right great toes swelling and erythemal 3.  MRSA nose and PCR 4.  Body mass index is 31.86 kg/(m^2).  Plan:  He is being seen by ID, and is on vancomycin.  Drain is functional and he is improving.  i would let the drain and antibiotics work and observe his progress.  We will follow with you.   Avynn Klassen 04/15/2014, 4:45 PM

## 2014-04-15 NOTE — Progress Notes (Signed)
UR Completed Burlene Montecalvo Graves-Bigelow, RN,BSN [phone]  

## 2014-04-15 NOTE — Progress Notes (Signed)
Subjective: Pain and swelling of Left Thigh   Objective: Vital signs in last 24 hours: Temp:  [98.3 F (36.8 C)-100.3 F (37.9 C)] 99 F (37.2 C) (07/09 0549) Pulse Rate:  [80-91] 91 (07/09 0549) Resp:  [16-18] 18 (07/09 0549) BP: (122-164)/(80-118) 124/80 mmHg (07/09 0549) SpO2:  [91 %-100 %] 91 % (07/09 0549)  Intake/Output from previous day: 07/08 0701 - 07/09 0700 In: 1296.3 [P.O.:480; I.V.:816.3] Out: 1850 [Urine:1850] Intake/Output this shift:     Recent Labs  04/14/14 2008 04/15/14 0438  HGB 14.1 13.5    Recent Labs  04/14/14 2008 04/15/14 0438  WBC 11.4* 11.0*  RBC 4.26 4.11*  HCT 40.8 39.8  PLT 308 319    Recent Labs  04/14/14 1623  NA 132*  K 4.0  CL 92*  CO2 27  BUN 17  CREATININE 0.85  GLUCOSE 110*  CALCIUM 8.9    Recent Labs  04/14/14 2008  INR 1.15    Pain and swelling of left thigh .   Assessment/Plan: Will have aspiration of left thigh today. His WBC is 11000 and he has been afebrile. His CRP is 17 and Sed Rate is 65.Infectious Disease will also evaluate today.Overall he is not in any acute distress as you would see in a Necrotizing Fascitis.Marland Kitchen   Kiylee Thoreson A 04/15/2014, 7:16 AM

## 2014-04-15 NOTE — Progress Notes (Addendum)
    [facility]       AS LONG AS PATIENT REMAINS STABLE PLEASE:      DO NOT START ANTIBIOTICS UNTIL THIS PATIENTS ABSCESS, HEMATOMA HAS BEEN ASPIRATED BY IR FOR CULTURES

## 2014-04-15 NOTE — Progress Notes (Signed)
    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 I dont see that we need any GNR coverage or anerobic coverage unless there is a synergistic GAS + other microbes (not likely) at this point  I will add an order of zyvox 600mg  po bid which like clindamycin will provide inhibition of toxin production by same mechanism as clindamycin - of ribosomes.  IF there is any clinical deterioration would be OK to replace zyvox with clinda but IF that is the case then he also needs to go to OR promptly

## 2014-04-15 NOTE — Consult Note (Addendum)
Is clinically improving with drainage and antibiotics, keep it simple for now and follow.  Add clinda/Zosyn per nec fasciitis protocol just in case.  F/u Cx's  If not improving or worsening, consider open exploration.

## 2014-04-16 LAB — CBC WITH DIFFERENTIAL/PLATELET
Basophils Absolute: 0 10*3/uL (ref 0.0–0.1)
Basophils Relative: 0 % (ref 0–1)
Eosinophils Absolute: 0.2 10*3/uL (ref 0.0–0.7)
Eosinophils Relative: 2 % (ref 0–5)
HCT: 40.1 % (ref 39.0–52.0)
Hemoglobin: 13.6 g/dL (ref 13.0–17.0)
Lymphocytes Relative: 14 % (ref 12–46)
Lymphs Abs: 1.4 10*3/uL (ref 0.7–4.0)
MCH: 32.9 pg (ref 26.0–34.0)
MCHC: 33.9 g/dL (ref 30.0–36.0)
MCV: 96.9 fL (ref 78.0–100.0)
Monocytes Absolute: 1.1 10*3/uL — ABNORMAL HIGH (ref 0.1–1.0)
Monocytes Relative: 11 % (ref 3–12)
Neutro Abs: 7 10*3/uL (ref 1.7–7.7)
Neutrophils Relative %: 73 % (ref 43–77)
Platelets: 280 10*3/uL (ref 150–400)
RBC: 4.14 MIL/uL — ABNORMAL LOW (ref 4.22–5.81)
RDW: 13.9 % (ref 11.5–15.5)
WBC: 9.7 10*3/uL (ref 4.0–10.5)

## 2014-04-16 MED ORDER — LINEZOLID 2 MG/ML IV SOLN
600.0000 mg | Freq: Two times a day (BID) | INTRAVENOUS | Status: DC
  Administered 2014-04-16 – 2014-04-18 (×6): 600 mg via INTRAVENOUS
  Filled 2014-04-16 (×8): qty 300

## 2014-04-16 MED ORDER — ENOXAPARIN SODIUM 40 MG/0.4ML ~~LOC~~ SOLN
40.0000 mg | SUBCUTANEOUS | Status: DC
  Administered 2014-04-16 – 2014-04-20 (×5): 40 mg via SUBCUTANEOUS
  Filled 2014-04-16 (×7): qty 0.4

## 2014-04-16 NOTE — Progress Notes (Signed)
Subjective: Much better today. Seen by myself,Dr. Linus Salmons and Dr.Gross. We all agree to continue drain and antibiotics and watch closely. Will start Lovenox and observe. His WBC has decreased from 11,000 to 9,700 and he is afebrile. His thigh is much better.   Objective: Vital signs in last 24 hours: Temp:  [98 F (36.7 C)-98.6 F (37 C)] 98.6 F (37 C) (07/10 0619) Pulse Rate:  [78-93] 79 (07/10 0619) Resp:  [16-21] 18 (07/10 0619) BP: (122-144)/(76-98) 144/90 mmHg (07/10 0619) SpO2:  [95 %-99 %] 96 % (07/10 0619) Weight:  [106.595 kg (235 lb)] 106.595 kg (235 lb) (07/09 1503)  Intake/Output from previous day: 07/09 0701 - 07/10 0700 In: 2708.8 [P.O.:360; I.V.:1828.8; IV Piggyback:500] Out: 5730 [Urine:5550; Drains:180] Intake/Output this shift:     Recent Labs  04/14/14 2008 04/15/14 0438 04/16/14 0739  HGB 14.1 13.5 13.6    Recent Labs  04/15/14 0438 04/16/14 0739  WBC 11.0* 9.7  RBC 4.11* 4.14*  HCT 39.8 40.1  PLT 319 280    Recent Labs  04/14/14 1623  NA 132*  K 4.0  CL 92*  CO2 27  BUN 17  CREATININE 0.85  GLUCOSE 110*  CALCIUM 8.9    Recent Labs  04/14/14 2008  INR 1.15    Drain is functioning well.  Assessment/Plan: See above. Will treat with drain and Antibiotics.   Nicholas [name] A 04/16/2014, 8:24 AM

## 2014-04-16 NOTE — Progress Notes (Signed)
Nicholas  [name]., Nicholas [name], [name] [address] Phone: [phone] FAX: [phone]    Nicholas [name] [id number] [birth year]  CARE TEAM:  PCP: Sandi Mariscal, MD  Outpatient Care Team: Patient Care Team: Sandi Mariscal, MD as PCP - General (Internal Medicine)  Inpatient Treatment Team: Treatment Team: Attending Provider: Tobi Bastos, MD; Technician: Francoise Schaumann, NT; Consulting Physician: Tobi Bastos, MD; Technician: Rhae Hammock, NT; Consulting Physician: Nolon Nations, MD; Registered Nurse: Jordan Likes, RN   Subjective:  Pain less but still w LBP & L>R prox thigh pain Dr Linus Salmons w ID & Dr Gladstone Lighter at bedside  Objective:  Vital signs:  Filed Vitals:   04/15/14 1503 04/15/14 2015 04/16/14 0352 04/16/14 0619  BP:  130/83 144/87 144/90  Pulse:  81 78 79  Temp:  98.6 F (37 C) 98 F (36.7 C) 98.6 F (37 C)  TempSrc:  Oral Oral Oral  Resp:  _0 Height: 6' (1.829 m)     Weight: 235 lb (106.595 kg)     SpO2:  96% 95% 96%    Last BM Date: 04/05/14  Intake/Output   Yesterday:  07/09 0701 - 07/10 0700 In: 2708.8 [P.O.:360; I.V.:1828.8; IV Piggyback:500] Out: 5730 [Urine:5550; Drains:180] This shift:     Bowel function:  Flatus: y  BM: y  Drain: sanginopurulent  Physical Exam:  General: Pt awake/alert/oriented x4 in no acute distress Eyes: PERRL, normal EOM.  Sclera clear.  No icterus Neuro: CN II-XII intact w/o focal sensory/motor deficits. Lymph: No head/neck/groin lymphadenopathy Psych:  No delerium/psychosis/paranoia HENT: Normocephalic, Mucus membranes moist.  No thrush Neck: Supple, No tracheal deviation Chest: No chest wall pain w good excursion CV:  Pulses intact.  Regular rhythm MS: Normal AROM mjr joints.  No obvious deformity Abdomen: Soft.  Nondistended.  Nontender.  No evidence of peritonitis.  No incarcerated hernias.  Ext:  SCDs BLE.  No mjr edema.  No cyanosis  L thigh TTP &  swollen but no crepitus.  Pain w hip flexion but improved   Skin: No petechiae / purpura   Problem List:   Active Problems:   Abscess of left thigh   Assessment  Nicholas [name]  58 y.o. male       Prob infected thigh hematoma - improved w lower pain, lower WBC  Plan:  -cont drain 7-14 days (drainage serous & <6m/day x 2 days -cont Abx - per ID -not c/w nec fasciitis.  If not improved or worsens, OR exploration of thigh w open wound & packing - hold off for no since feeling better -VTE prophylaxis- SCDs, etc -mobilize as tolerated to help recovery  SAdin Hector M.D., F.A.C.S. [facility] [address] [place] [phone] / Paging   04/16/2014   Results:   Labs: Results for orders placed during the hospital encounter of 04/14/14 (from the past 48 hour(s))  BASIC METABOLIC PANEL     Status: Abnormal   Collection Time    04/14/14  4:23 PM      Result Value Ref Range   Sodium 132 (*) 137 - 147 mEq/L   Potassium 4.0  3.7 - 5.3 mEq/L   Chloride 92 (*) 96 - 112 mEq/L   CO2 27  19 - 32 mEq/L   Glucose, Bld 110 (*) 70 - 99 mg/dL   BUN 17  6 - 23 mg/dL  Creatinine, Ser 0.85  0.50 - 1.35 mg/dL   Calcium 8.9  8.4 - 10.5 mg/dL   GFR calc non Af Amer >90  >90 mL/min   GFR calc Af Amer >90  >90 mL/min   Comment: (NOTE)     The eGFR has been calculated using the CKD EPI equation.     This calculation has not been validated in all clinical situations.     eGFR's persistently <90 mL/min signify possible Chronic Kidney     Disease.   Anion gap 13  5 - 15  C-REACTIVE PROTEIN     Status: Abnormal   Collection Time    04/14/14  8:05 PM      Result Value Ref Range   CRP 17.0 (*) <0.60 mg/dL   Comment: Performed at [place]     Status: Abnormal   Collection Time    04/14/14  8:05 PM      Result Value Ref Range   Sed Rate 65 (*) 0 -  16 mm/hr  CBC WITH DIFFERENTIAL     Status: Abnormal   Collection Time    04/14/14  8:08 PM      Result Value Ref Range   WBC 11.4 (*) 4.0 - 10.5 K/uL   RBC 4.26  4.22 - 5.81 MIL/uL   Hemoglobin 14.1  13.0 - 17.0 g/dL   HCT 40.8  39.0 - 52.0 %   MCV 95.8  78.0 - 100.0 fL   MCH 33.1  26.0 - 34.0 pg   MCHC 34.6  30.0 - 36.0 g/dL   RDW 14.1  11.5 - 15.5 %   Platelets 308  150 - 400 K/uL   Neutrophils Relative % 76  43 - 77 %   Neutro Abs 8.7 (*) 1.7 - 7.7 K/uL   Lymphocytes Relative 12  12 - 46 %   Lymphs Abs 1.4  0.7 - 4.0 K/uL   Monocytes Relative 10  3 - 12 %   Monocytes Absolute 1.1 (*) 0.1 - 1.0 K/uL   Eosinophils Relative 2  0 - 5 %   Eosinophils Absolute 0.2  0.0 - 0.7 K/uL   Basophils Relative 0  0 - 1 %   Basophils Absolute 0.0  0.0 - 0.1 K/uL  APTT     Status: None   Collection Time    04/14/14  8:08 PM      Result Value Ref Range   aPTT 24  24 - 37 seconds  PROTIME-INR     Status: None   Collection Time    04/14/14  8:08 PM      Result Value Ref Range   Prothrombin Time 14.7  11.6 - 15.2 seconds   INR 1.15  0.00 - 1.49  SURGICAL PCR SCREEN     Status: Abnormal   Collection Time    04/14/14  8:36 PM      Result Value Ref Range   MRSA, PCR POSITIVE (*) NEGATIVE   Comment: RESULT CALLED TO, READ BACK BY AND VERIFIED WITH:     J.CRUTCHFIELD,RN AT 2252 ON 04/14/14 BY WSHEA   Staphylococcus aureus POSITIVE (*) NEGATIVE   Comment:            The Xpert SA Assay (FDA     approved for NASAL specimens     in patients over 53 years of age),     is one component of     a comprehensive surveillance     program.  Test performance has     been validated by [facility] for patients greater     than or equal to 39 year old.     It is not intended     to diagnose infection nor to     guide or monitor treatment.  CBC WITH DIFFERENTIAL     Status: Abnormal   Collection Time    04/15/14  4:38 AM      Result Value Ref Range   WBC 11.0 (*) 4.0 - 10.5 K/uL   RBC 4.11 (*)  4.22 - 5.81 MIL/uL   Hemoglobin 13.5  13.0 - 17.0 g/dL   HCT 39.8  39.0 - 52.0 %   MCV 96.8  78.0 - 100.0 fL   MCH 32.8  26.0 - 34.0 pg   MCHC 33.9  30.0 - 36.0 g/dL   RDW 14.3  11.5 - 15.5 %   Platelets 319  150 - 400 K/uL   Neutrophils Relative % 76  43 - 77 %   Neutro Abs 8.4 (*) 1.7 - 7.7 K/uL   Lymphocytes Relative 10 (*) 12 - 46 %   Lymphs Abs 1.1  0.7 - 4.0 K/uL   Monocytes Relative 12  3 - 12 %   Monocytes Absolute 1.4 (*) 0.1 - 1.0 K/uL   Eosinophils Relative 2  0 - 5 %   Eosinophils Absolute 0.2  0.0 - 0.7 K/uL   Basophils Relative 0  0 - 1 %   Basophils Absolute 0.0  0.0 - 0.1 K/uL  CULTURE, ROUTINE-ABSCESS     Status: None   Collection Time    04/15/14  3:00 PM      Result Value Ref Range   Specimen Description ABSCESS LEFT THIGH     Special Requests NONE     Gram Stain       Value: WBC PRESENT,BOTH PMN AND MONONUCLEAR     SQUAMOUS EPITHELIAL CELLS PRESENT     GRAM POSITIVE COCCI     Performed by [facility] CYTOSPIN     Performed at [facility]   Culture PENDING     Report Status PENDING    GRAM STAIN     Status: None   Collection Time    04/15/14  3:00 PM      Result Value Ref Range   Specimen Description ABSCESS LEFT THIGH     Special Requests NONE     Gram Stain       Value: CYTOSPIN     WBC PRESENT,BOTH PMN AND MONONUCLEAR     GRAM POSITIVE COCCI     Gram Stain Report Called to,Read Back By and Verified WithSherrin Daisy RN [id number] 04/15/14 A NAVARRO   Report Status 04/15/2014 FINAL    CBC WITH DIFFERENTIAL     Status: Abnormal   Collection Time    04/16/14  7:39 AM      Result Value Ref Range   WBC 9.7  4.0 - 10.5 K/uL   RBC 4.14 (*) 4.22 - 5.81 MIL/uL   Hemoglobin 13.6  13.0 - 17.0 g/dL   HCT 40.1  39.0 - 52.0 %   MCV 96.9  78.0 - 100.0 fL   MCH 32.9  26.0 - 34.0 pg   MCHC 33.9  30.0 - 36.0 g/dL   RDW 13.9  11.5 - 15.5 %   Platelets 280  150 - 400 K/uL   Neutrophils Relative % 73  43 - 77 %  Neutro Abs 7.0  1.7 - 7.7 K/uL    Lymphocytes Relative 14  12 - 46 %   Lymphs Abs 1.4  0.7 - 4.0 K/uL   Monocytes Relative 11  3 - 12 %   Monocytes Absolute 1.1 (*) 0.1 - 1.0 K/uL   Eosinophils Relative 2  0 - 5 %   Eosinophils Absolute 0.2  0.0 - 0.7 K/uL   Basophils Relative 0  0 - 1 %   Basophils Absolute 0.0  0.0 - 0.1 K/uL    Imaging / Studies: Ct Pelvis W Contrast  04/14/2014   ADDENDUM REPORT: 04/14/2014 13:56  ADDENDUM: Critical Value/emergent results were called by telephone at the time of interpretation on 04/14/2014 at 1:56 PM to Dr. Latanya Maudlin , who verbally acknowledged these results.   Electronically Signed   By: Sabino Dick M.D.   On: 04/14/2014 13:56   04/14/2014   CLINICAL DATA:  Thigh pain.  EXAM: CT PELVIS WITH CONTRAST  TECHNIQUE: Multidetector CT imaging of the pelvis was performed using the standard protocol following the bolus administration of intravenous contrast.  CONTRAST:  139m OMNIPAQUE IOHEXOL 300 MG/ML  SOLN  COMPARISON:  None.  FINDINGS: No significant osseous abnormality is noted. Urinary bladder appears normal. No intraperitoneal fluid collection is noted. Visualized portion of the appendix appears normal. 12.9 x 5.2 x 3.9 cm peripherally enhancing fluid collection is noted beginning medially and along the course of the left adductor longus muscle. Small amount of gas is noted in the superior portion of this abnormality. Stranding of the overlying subcutaneous tissues is noted concerning for possible cellulitis or edema. Mildly enlarged lymph nodes are noted in the left external iliac region, all of which are less than 1 cm in minor axis. These most likely are inflammatory in origin.  IMPRESSION: Peripherally enhancing fluid collection measuring 12.9 x 5.2 x 3.9 cm is noted along the medial portion of the left adductor longus muscle in the proximal left thigh and inguinal region. The contain soft tissue gas and has stranding of the overlying subcutaneous tissues and is concerning for probable abscess.  However, hematoma cannot be excluded but felt much less likely. Probable mild inflammatory adenopathy seen in left external iliac region. I contacted Dr. GCharlestine Nightoffice, but he is currently in surgery and they will contact him. These results will be called to the ordering clinician or representative by the Radiologist Assistant, and communication documented in the PACS or zVision Dashboard.  Electronically Signed: By: JSabino DickM.D. On: 04/14/2014 13:47   [name]  04/14/2014   CLINICAL DATA:  Bilateral leg pain  EXAM: MRI THORACIC SPINE WITHOUT CONTRAST  TECHNIQUE: Multiplanar, multisequence MR imaging of the thoracic spine was performed. No intravenous contrast was administered.  COMPARISON:  None.  FINDINGS: The vertebral body heights are maintained. The alignment is anatomic. The marrow signal is normal. There is no acute fracture or static listhesis. The thoracic spinal cord is normal in size and signal.  The disc spaces are relatively well maintained. There is no central canal or foraminal stenosis. There is a mild broad-based disc bulge at T2-3. There is a minimal broad-based disc bulge at T3-4. There is a small right paracentral disc protrusion T7-8 with mild deformity of the right ventral paracentral thoracic spinal cord. There is a small left paracentral disc protrusion at T8-9. There is a right paracentral small disc protrusion T9-10 abutting the ventral thoracic spinal cord. There is a small right paracentral disc protrusion  T11-12 abutting the ventral cervical spinal cord.  IMPRESSION: 1. Small right paracentral disc protrusion T7-8 with mild deformity of the right ventral paracentral thoracic spinal cord. 2. Small left paracentral disc protrusion at T8-9. 3. Right paracentral small disc protrusion T9-10 abutting the ventral thoracic spinal cord. 4. Small right paracentral disc protrusion T11-12 abutting the ventral cervical spinal cord.   Electronically Signed   By: Kathreen Devoid   On: 04/14/2014 09:59   US Aspiration  04/15/2014   CLINICAL DATA:  Left medial thigh fluid collection concerning for abscess by imaging. The patient presents for aspiration and drainage under ultrasound guidance.  EXAM: ULTRASOUND GUIDED PERCUTANEOUS CATHETER DRAINAGE OF LEFT THIGH ABSCESS  MEDICATIONS: 4.0 mg IV Versed; 100 mcg IV Fentanyl  Total Moderate Sedation Time: 15 min  PROCEDURE: The procedure, risks, benefits, and alternatives were explained to the patient. Questions regarding the procedure were encouraged and answered. The patient understands and consents to the procedure.  The medial left thigh was prepped with Betadine in a sterile fashion, and a sterile drape was applied covering the operative field. A sterile gown and sterile gloves were used for the procedure. Local anesthesia was provided with 1% Lidocaine.  Ultrasound was performed of the soft tissues of the left medial thigh. After choosing a site for access, an 18 gauge trocar needle was advanced under ultrasound guidance into the collection. Aspiration of fluid was performed and a sample sent for culture analysis. A guidewire was advanced through the needle.  The percutaneous tract was dilated and a 12 French percutaneous drainage catheter placed. Catheter position was confirmed by ultrasound. The catheter was flushed and connected to a suction bulb. It was secured at the skin with a Prolene retention suture and StatLock device.  COMPLICATIONS: None.  FINDINGS: Initial ultrasound shows an elongated, complex fluid collection of the left medial thigh containing mixed echogenicity fluid and tissue. Aspiration yielded grossly purulent fluid. After placement of the drainage catheter, there is very good return of fluid.  IMPRESSION: Ultrasound-guided drainage of left thigh abscess. Aspirated fluid was overtly purulent and a sample sent for culture analysis. A 12 French drain was placed in the collection and connected to suction bulb  drainage.   Electronically Signed   By: Aletta Edouard M.D.   On: 04/15/2014 16:12   Dg Foot Complete Right  04/14/2014   CLINICAL DATA:  Pain and swelling ; erythema  EXAM: RIGHT FOOT COMPLETE - 3+ VIEW  COMPARISON:  None.  FINDINGS: Frontal, oblique, and lateral views were obtained. There is no fracture or dislocation. 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 Other joint spaces appear unremarkable. There is no erosive change or bony destruction. There is no soft tissue calcification. There is some mild soft tissue swelling over the dorsum of the forefoot.  IMPRESSION: Soft tissue swelling over the dorsum of the forefoot. No bony destruction or erosion. 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 No fracture or dislocation.   Electronically Signed   By: Lowella Grip M.D.   On: 04/14/2014 16:23    Medications / Allergies: per chart  Antibiotics: Anti-infectives   Start     Dose/Rate Route Frequency Ordered Stop   04/16/14 1000  linezolid (ZYVOX) IVPB 600 mg    Comments:  Pharmacy may adjust time   600 mg 300 mL/hr over 60 Minutes Intravenous Every 12 hours 04/16/14 0818     04/16/14 0800  vancomycin (VANCOCIN) IVPB 1000 mg/200 mL premix  Status:  Discontinued  1,000 mg 200 mL/hr over 60 Minutes Intravenous Every 12 hours 04/15/14 1552 04/16/14 0818   04/15/14 2000  clindamycin (CLEOCIN) IVPB 600 mg  Status:  Discontinued     600 mg 100 mL/hr over 30 Minutes Intravenous Every 6 hours 04/15/14 1833 04/15/14 1933   04/15/14 2000  piperacillin-tazobactam (ZOSYN) IVPB 3.375 g  Status:  Discontinued     3.375 g 12.5 mL/hr over 240 Minutes Intravenous Every 8 hours 04/15/14 1849 04/15/14 1933   04/15/14 2000  linezolid (ZYVOX) tablet 600 mg  Status:  Discontinued     600 mg Oral Every 12 hours 04/15/14 1933 04/16/14 0804   04/15/14 1630  vancomycin (VANCOCIN) 2,250 mg in sodium chloride 0.9 % 500 mL IVPB     2,250 mg 250 mL/hr over 120 Minutes Intravenous  Once  04/15/14 1552 04/15/14 1944       Note: This dictation was prepared with voice recognition software technology. In this process, transcriptional errors may occur.  Attempts are made to proofread & provide accurate documentation.  Any errors are unintentional.

## 2014-04-16 NOTE — Progress Notes (Signed)
Subjective: Pt feeling a little better since drainage procedure; cont to have left > right thigh soreness, rt great toe soreness Objective: Vital signs in last 24 hours: Temp:  [98 F (36.7 C)-98.6 F (37 C)] 98.6 F (37 C) (07/10 0619) Pulse Rate:  [78-93] 79 (07/10 0619) Resp:  [16-21] 18 (07/10 0619) BP: (122-144)/(76-98) 144/90 mmHg (07/10 0619) SpO2:  [95 %-99 %] 96 % (07/10 0619) Weight:  [235 lb (106.595 kg)] 235 lb (106.595 kg) (07/09 1503) Last BM Date: 04/05/14  Intake/Output from previous day: 07/09 0701 - 07/10 0700 In: 2708.8 [P.O.:360; I.V.:1828.8; IV Piggyback:500] Out: 5730 [Urine:5550; Drains:180] Intake/Output this shift: Total I/O In: -  Out: 730 [Urine:700; Drains:30]  Left thigh drain intact, insertion site ok, mildly tender, output 30 cc's today purulent fluid; cx's- prelim staph  Lab Results:   Recent Labs  04/15/14 0438 04/16/14 0739  WBC 11.0* 9.7  HGB 13.5 13.6  HCT 39.8 40.1  PLT 319 280   BMET  Recent Labs  04/14/14 1623  NA 132*  K 4.0  CL 92*  CO2 27  GLUCOSE 110*  BUN 17  CREATININE 0.85  CALCIUM 8.9   PT/INR  Recent Labs  04/14/14 2008  LABPROT 14.7  INR 1.15   ABG No results found for this basename: PHART, PCO2, PO2, HCO3,  in the last 72 hours  Studies/Results: Ct Pelvis W Contrast  04/14/2014   ADDENDUM REPORT: 04/14/2014 13:56  ADDENDUM: Critical Value/emergent results were called by telephone at the time of interpretation on 04/14/2014 at 1:56 PM to Dr. Latanya Maudlin , who verbally acknowledged these results.   Electronically Signed   By: Sabino Dick M.D.   On: 04/14/2014 13:56   04/14/2014   CLINICAL DATA:  Thigh pain.  EXAM: CT PELVIS WITH CONTRAST  TECHNIQUE: Multidetector CT imaging of the pelvis was performed using the standard protocol following the bolus administration of intravenous contrast.  CONTRAST:  126mL OMNIPAQUE IOHEXOL 300 MG/ML  SOLN  COMPARISON:  None.  FINDINGS: No significant osseous  abnormality is noted. Urinary bladder appears normal. No intraperitoneal fluid collection is noted. Visualized portion of the appendix appears normal. 12.9 x 5.2 x 3.9 cm peripherally enhancing fluid collection is noted beginning medially and along the course of the left adductor longus muscle. Small amount of gas is noted in the superior portion of this abnormality. Stranding of the overlying subcutaneous tissues is noted concerning for possible cellulitis or edema. Mildly enlarged lymph nodes are noted in the left external iliac region, all of which are less than 1 cm in minor axis. These most likely are inflammatory in origin.  IMPRESSION: Peripherally enhancing fluid collection measuring 12.9 x 5.2 x 3.9 cm is noted along the medial portion of the left adductor longus muscle in the proximal left thigh and inguinal region. The contain soft tissue gas and has stranding of the overlying subcutaneous tissues and is concerning for probable abscess. However, hematoma cannot be excluded but felt much less likely. Probable mild inflammatory adenopathy seen in left external iliac region. I contacted Dr. Charlestine Night office, but he is currently in surgery and they will contact him. These results will be called to the ordering clinician or representative by the Radiologist Assistant, and communication documented in the PACS or zVision Dashboard.  Electronically Signed: By: Sabino Dick M.D. On: 04/14/2014 13:47   US Aspiration  04/15/2014   CLINICAL DATA:  Left medial thigh fluid collection concerning for abscess by imaging. The patient presents for aspiration and  drainage under ultrasound guidance.  EXAM: ULTRASOUND GUIDED PERCUTANEOUS CATHETER DRAINAGE OF LEFT THIGH ABSCESS  MEDICATIONS: 4.0 mg IV Versed; 100 mcg IV Fentanyl  Total Moderate Sedation Time: 15 min  PROCEDURE: The procedure, risks, benefits, and alternatives were explained to the patient. Questions regarding the procedure were encouraged and answered. The  patient understands and consents to the procedure.  The medial left thigh was prepped with Betadine in a sterile fashion, and a sterile drape was applied covering the operative field. A sterile gown and sterile gloves were used for the procedure. Local anesthesia was provided with 1% Lidocaine.  Ultrasound was performed of the soft tissues of the left medial thigh. After choosing a site for access, an 18 gauge trocar needle was advanced under ultrasound guidance into the collection. Aspiration of fluid was performed and a sample sent for culture analysis. A guidewire was advanced through the needle.  The percutaneous tract was dilated and a 12 French percutaneous drainage catheter placed. Catheter position was confirmed by ultrasound. The catheter was flushed and connected to a suction bulb. It was secured at the skin with a Prolene retention suture and StatLock device.  COMPLICATIONS: None.  FINDINGS: Initial ultrasound shows an elongated, complex fluid collection of the left medial thigh containing mixed echogenicity fluid and tissue. Aspiration yielded grossly purulent fluid. After placement of the drainage catheter, there is very good return of fluid.  IMPRESSION: Ultrasound-guided drainage of left thigh abscess. Aspirated fluid was overtly purulent and a sample sent for culture analysis. A 12 French drain was placed in the collection and connected to suction bulb drainage.   Electronically Signed   By: Aletta Edouard M.D.   On: 04/15/2014 16:12   Dg Foot Complete Right  04/14/2014   CLINICAL DATA:  Pain and swelling ; erythema  EXAM: RIGHT FOOT COMPLETE - 3+ VIEW  COMPARISON:  None.  FINDINGS: Frontal, oblique, and lateral views were obtained. There is no fracture or dislocation. 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 Other joint spaces appear unremarkable. There is no erosive change or bony destruction. There is no soft tissue calcification. There is some mild soft tissue swelling over the  dorsum of the forefoot.  IMPRESSION: Soft tissue swelling over the dorsum of the forefoot. No bony destruction or erosion. 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 No fracture or dislocation.   Electronically Signed   By: Lowella Grip M.D.   On: 04/14/2014 16:23    Anti-infectives: Anti-infectives   Start     Dose/Rate Route Frequency Ordered Stop   04/16/14 1000  linezolid (ZYVOX) IVPB 600 mg    Comments:  Pharmacy may adjust time   600 mg 300 mL/hr over 60 Minutes Intravenous Every 12 hours 04/16/14 0818     04/16/14 0800  vancomycin (VANCOCIN) IVPB 1000 mg/200 mL premix  Status:  Discontinued     1,000 mg 200 mL/hr over 60 Minutes Intravenous Every 12 hours 04/15/14 1552 04/16/14 0818   04/15/14 2000  clindamycin (CLEOCIN) IVPB 600 mg  Status:  Discontinued     600 mg 100 mL/hr over 30 Minutes Intravenous Every 6 hours 04/15/14 1833 04/15/14 1933   04/15/14 2000  piperacillin-tazobactam (ZOSYN) IVPB 3.375 g  Status:  Discontinued     3.375 g 12.5 mL/hr over 240 Minutes Intravenous Every 8 hours 04/15/14 1849 04/15/14 1933   04/15/14 2000  linezolid (ZYVOX) tablet 600 mg  Status:  Discontinued     600 mg Oral Every 12 hours  04/15/14 1933 04/16/14 0804   04/15/14 1630  vancomycin (VANCOCIN) 2,250 mg in sodium chloride 0.9 % 500 mL IVPB     2,250 mg 250 mL/hr over 120 Minutes Intravenous  Once 04/15/14 1552 04/15/14 1944      Assessment/Plan: s/p left thigh abscess drainage 7/9; cont current tx; check final cx's; other plans as per CCS/ortho/ID;? consider LE venous dopplers with hx bilat edema  LOS: 2 days    ALLRED,D [facility] 04/16/2014

## 2014-04-16 NOTE — Progress Notes (Signed)
Pt's JP drain irrigated with 10cc NS. Will continue to monitor.

## 2014-04-16 NOTE — Progress Notes (Signed)
   Subjective: Patient reports pain as moderate.   Patient seen in rounds with Dr. Gladstone Lighter. Patient is well, and has had no acute complaints or problems other than the pain in the upper thighs, left greater than right. No SOB or chest pain. No nausea or vomiting. Reports that he feels that "a little pressure has been relieved" since the placement of the drain yesterday.    Objective: Vital signs in last 24 hours: Temp:  [98 F (36.7 C)-98.6 F (37 C)] 98.6 F (37 C) (07/10 0619) Pulse Rate:  [78-93] 79 (07/10 0619) Resp:  [16-21] 18 (07/10 0619) BP: (122-144)/(76-98) 144/90 mmHg (07/10 0619) SpO2:  [95 %-99 %] 96 % (07/10 0619) Weight:  [106.595 kg (235 lb)] 106.595 kg (235 lb) (07/09 1503)  Intake/Output from previous day:  Intake/Output Summary (Last 24 hours) at 04/16/14 0725 Last data filed at 04/16/14 8315  Gross per 24 hour  Intake 2708.75 ml  Output   5730 ml  Net -3021.25 ml     Labs:  Recent Labs  04/14/14 2008 04/15/14 0438  HGB 14.1 13.5    Recent Labs  04/14/14 2008 04/15/14 0438  WBC 11.4* 11.0*  RBC 4.26 4.11*  HCT 40.8 39.8  PLT 308 319    Recent Labs  04/14/14 1623  NA 132*  K 4.0  CL 92*  CO2 27  BUN 17  CREATININE 0.85  GLUCOSE 110*  CALCIUM 8.9    Recent Labs  04/14/14 2008  INR 1.15    EXAM General - Patient is Alert and Oriented Extremity - Neurologically intact Intact pulses distally Dorsiflexion/Plantar flexion intact Compartment soft Swelling over the left LE continues. Improved in the right. Erythema over the anterior portion of the medial left thigh.  Swelling and erythema over the right great toe. No active drainage.   History reviewed. No pertinent past medical history.  Assessment/Plan: Active Problems:   Abscess of left thigh  Estimated body mass index is 31.86 kg/(m^2) as calculated from the following:   Height as of this encounter: 6' (1.829 m).   Weight as of this encounter: 106.595 kg (235  lb). Advance diet Up with therapy Continue ABX therapy due to infection Continue foley due to urinary output monitoring  DVT Prophylaxis - None currently. May start on Lovenox depending on decision regarding surgery.   Dr. Gladstone Lighter will plan on discussing this case further with general surgery to make a decision about continuing treatment with the drain or opening the thigh up. Will remain NPO until decision made about surgery. Continue antibiotics per ID.   Ardeen Jourdain, PA-C Orthopaedic Surgery 04/16/2014, 7:25 AM

## 2014-04-16 NOTE — Progress Notes (Signed)
Agree.  Still draining purulent fluid.  Will leave drain in pending potential additional surgical I&D.
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
## 2014-04-17 DIAGNOSIS — M79609 Pain in unspecified limb: Secondary | ICD-10-CM

## 2014-04-17 LAB — CULTURE, ROUTINE-ABSCESS

## 2014-04-17 LAB — CBC WITH DIFFERENTIAL/PLATELET
Basophils Absolute: 0 10*3/uL (ref 0.0–0.1)
Basophils Relative: 0 % (ref 0–1)
Eosinophils Absolute: 0.3 10*3/uL (ref 0.0–0.7)
Eosinophils Relative: 3 % (ref 0–5)
HCT: 41.9 % (ref 39.0–52.0)
Hemoglobin: 14.1 g/dL (ref 13.0–17.0)
Lymphocytes Relative: 18 % (ref 12–46)
Lymphs Abs: 1.7 10*3/uL (ref 0.7–4.0)
MCH: 32.1 pg (ref 26.0–34.0)
MCHC: 33.7 g/dL (ref 30.0–36.0)
MCV: 95.4 fL (ref 78.0–100.0)
Monocytes Absolute: 0.9 10*3/uL (ref 0.1–1.0)
Monocytes Relative: 9 % (ref 3–12)
Neutro Abs: 6.8 10*3/uL (ref 1.7–7.7)
Neutrophils Relative %: 70 % (ref 43–77)
Platelets: 350 10*3/uL (ref 150–400)
RBC: 4.39 MIL/uL (ref 4.22–5.81)
RDW: 13.8 % (ref 11.5–15.5)
WBC: 9.8 10*3/uL (ref 4.0–10.5)

## 2014-04-17 MED ORDER — POLYETHYLENE GLYCOL 3350 17 G PO PACK
17.0000 g | PACK | Freq: Every day | ORAL | Status: DC
  Administered 2014-04-17 – 2014-04-21 (×5): 17 g via ORAL
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
## 2014-04-17 NOTE — Progress Notes (Signed)
Subjective: Pt feeling better; still constipated; voiding ok; has some bilat upper thigh soreness but improved; has been OOB  Objective: Vital signs in last 24 hours: Temp:  [98 F (36.7 C)-98.6 F (37 C)] 98 F (36.7 C) (07/11 0605) Pulse Rate:  [78-94] 94 (07/11 0605) Resp:  [18-19] 18 (07/11 0605) BP: (144-157)/(89-96) 144/96 mmHg (07/11 0605) SpO2:  [94 %-96 %] 96 % (07/11 0605) Last BM Date: 04/05/14  Intake/Output from previous day: 07/10 0701 - 07/11 0700 In: 2178.8 [P.O.:360; I.V.:1798.8] Out: 3242 [Urine:3135; Drains:107] Intake/Output this shift: Total I/O In: 240 [P.O.:240] Out: 220 [Urine:200; Drains:20]  Left thigh drain intact, insertion site ok, mildly tender, erythema improved, output 20 cc's (107 cc's 7/10); cx's- MRSA; LLE with 1-2 + edema, right with no sig edema today.WBC nl   Lab Results:   Recent Labs  04/16/14 0739 04/17/14 0440  WBC 9.7 9.8  HGB 13.6 14.1  HCT 40.1 41.9  PLT 280 350   BMET  Recent Labs  04/14/14 1623  NA 132*  K 4.0  CL 92*  CO2 27  GLUCOSE 110*  BUN 17  CREATININE 0.85  CALCIUM 8.9   PT/INR  Recent Labs  04/14/14 2008  LABPROT 14.7  INR 1.15   ABG No results found for this basename: PHART, PCO2, PO2, HCO3,  in the last 72 hours  Studies/Results: US Aspiration  04/15/2014   CLINICAL DATA:  Left medial thigh fluid collection concerning for abscess by imaging. The patient presents for aspiration and drainage under ultrasound guidance.  EXAM: ULTRASOUND GUIDED PERCUTANEOUS CATHETER DRAINAGE OF LEFT THIGH ABSCESS  MEDICATIONS: 4.0 mg IV Versed; 100 mcg IV Fentanyl  Total Moderate Sedation Time: 15 min  PROCEDURE: The procedure, risks, benefits, and alternatives were explained to the patient. Questions regarding the procedure were encouraged and answered. The patient understands and consents to the procedure.  The medial left thigh was prepped with Betadine in a sterile fashion, and a sterile drape was applied  covering the operative field. A sterile gown and sterile gloves were used for the procedure. Local anesthesia was provided with 1% Lidocaine.  Ultrasound was performed of the soft tissues of the left medial thigh. After choosing a site for access, an 18 gauge trocar needle was advanced under ultrasound guidance into the collection. Aspiration of fluid was performed and a sample sent for culture analysis. A guidewire was advanced through the needle.  The percutaneous tract was dilated and a 12 French percutaneous drainage catheter placed. Catheter position was confirmed by ultrasound. The catheter was flushed and connected to a suction bulb. It was secured at the skin with a Prolene retention suture and StatLock device.  COMPLICATIONS: None.  FINDINGS: Initial ultrasound shows an elongated, complex fluid collection of the left medial thigh containing mixed echogenicity fluid and tissue. Aspiration yielded grossly purulent fluid. After placement of the drainage catheter, there is very good return of fluid.  IMPRESSION: Ultrasound-guided drainage of left thigh abscess. Aspirated fluid was overtly purulent and a sample sent for culture analysis. A 12 French drain was placed in the collection and connected to suction bulb drainage.   Electronically Signed   By: Aletta Edouard M.D.   On: 04/15/2014 16:12    Anti-infectives: Anti-infectives   Start     Dose/Rate Route Frequency Ordered Stop   04/16/14 1000  linezolid (ZYVOX) IVPB 600 mg    Comments:  Pharmacy may adjust time   600 mg 300 mL/hr over 60 Minutes Intravenous Every 12 hours 04/16/14  0818     04/16/14 0800  vancomycin (VANCOCIN) IVPB 1000 mg/200 mL premix  Status:  Discontinued     1,000 mg 200 mL/hr over 60 Minutes Intravenous Every 12 hours 04/15/14 1552 04/16/14 0818   04/15/14 2000  clindamycin (CLEOCIN) IVPB 600 mg  Status:  Discontinued     600 mg 100 mL/hr over 30 Minutes Intravenous Every 6 hours 04/15/14 1833 04/15/14 1933   04/15/14  2000  piperacillin-tazobactam (ZOSYN) IVPB 3.375 g  Status:  Discontinued     3.375 g 12.5 mL/hr over 240 Minutes Intravenous Every 8 hours 04/15/14 1849 04/15/14 1933   04/15/14 2000  linezolid (ZYVOX) tablet 600 mg  Status:  Discontinued     600 mg Oral Every 12 hours 04/15/14 1933 04/16/14 0804   04/15/14 1630  vancomycin (VANCOCIN) 2,250 mg in sodium chloride 0.9 % 500 mL IVPB     2,250 mg 250 mL/hr over 120 Minutes Intravenous  Once 04/15/14 1552 04/15/14 1944      Assessment/Plan: S/p left upper thigh MRSA abscess drainage 7/9; cont current tx; other plans as per ID/CCS/ortho  LOS: 3 days    Nyari Olsson,D [facility] 04/17/2014

## 2014-04-17 NOTE — Progress Notes (Signed)
Subjective: Much better today. No erythema and very minimal pain in thigh. He has been up ambulating,white blood cell count is now stable and drain is functioning. Dr. Drucilla Schmidt is managing his antibiotics.Afebrile   Objective: Vital signs in last 24 hours: Temp:  [98 F (36.7 C)-98.6 F (37 C)] 98 F (36.7 C) (07/11 0605) Pulse Rate:  [78-94] 94 (07/11 0605) Resp:  [18-19] 18 (07/11 0605) BP: (144-157)/(89-96) 144/96 mmHg (07/11 0605) SpO2:  [94 %-96 %] 96 % (07/11 0605)  Intake/Output from previous day: 07/10 0701 - 07/11 0700 In: 2178.8 [P.O.:360; I.V.:1798.8] Out: 3242 [Urine:3135; Drains:107] Intake/Output this shift: Total I/O In: 240 [P.O.:240] Out: 220 [Urine:200; Drains:20]   Recent Labs  04/14/14 2008 04/15/14 0438 04/16/14 0739 04/17/14 0440  HGB 14.1 13.5 13.6 14.1    Recent Labs  04/16/14 0739 04/17/14 0440  WBC 9.7 9.8  RBC 4.14* 4.39  HCT 40.1 41.9  PLT 280 350    Recent Labs  04/14/14 1623  NA 132*  K 4.0  CL 92*  CO2 27  BUN 17  CREATININE 0.85  GLUCOSE 110*  CALCIUM 8.9    Recent Labs  04/14/14 2008  INR 1.15    Compartment soft  Assessment/Plan: Will observe over the weekend.   Jullian Clayson A 04/17/2014, 12:01 PM

## 2014-04-17 NOTE — Progress Notes (Signed)
General Surgery Note  LOS: 3 days  POD -     Assessment/Plan: 1.  Left thigh abscess - medial portion of the left adductor longus  Muscle - perc drain  On Linezolid  Cultures - Staph Aureus (prelim)  Somewhat better.  2.  DVT prophylaxis - Lovenox 3.  On isolation for MRSA - nasal 4.  Had trouble voiding, better 5.  Constipation   Active Problems:   Abscess of left thigh  Subjective:  Doing better, though still sore in thighs/hips.  Actually the right is sorer than the left. Objective:   Filed Vitals:   04/17/14 0605  BP: 144/96  Pulse: 94  Temp: 98 F (36.7 C)  Resp: 18     Intake/Output from previous day:  07/10 0701 - 07/11 0700 In: 2178.8 [P.O.:360; I.V.:1798.8] Out: 3242 [Urine:3135; Drains:107]  Intake/Output this shift:      Physical Exam:   General: WN WM who is alert and oriented.    HEENT: Normal. Pupils equal. .   Lungs: Clear   Abdomen: Soft   Wound: drain from medial left thigh.  107 cc recorded from drain yesterday.   Lab Results:    Recent Labs  04/16/14 0739 04/17/14 0440  WBC 9.7 9.8  HGB 13.6 14.1  HCT 40.1 41.9  PLT 280 350    BMET   Recent Labs  04/14/14 1623  NA 132*  K 4.0  CL 92*  CO2 27  GLUCOSE 110*  BUN 17  CREATININE 0.85  CALCIUM 8.9    PT/INR   Recent Labs  04/14/14 2008  LABPROT 14.7  INR 1.15    ABG  No results found for this basename: PHART, PCO2, PO2, HCO3,  in the last 72 hours   Studies/Results:  US Aspiration  04/24/14   CLINICAL DATA:  Left medial thigh fluid collection concerning for abscess by imaging. The patient presents for aspiration and drainage under ultrasound guidance.  EXAM: ULTRASOUND GUIDED PERCUTANEOUS CATHETER DRAINAGE OF LEFT THIGH ABSCESS  MEDICATIONS: 4.0 mg IV Versed; 100 mcg IV Fentanyl  Total Moderate Sedation Time: 15 min  PROCEDURE: The procedure, risks, benefits, and alternatives were explained to the patient. Questions regarding the procedure were encouraged and answered.  The patient understands and consents to the procedure.  The medial left thigh was prepped with Betadine in a sterile fashion, and a sterile drape was applied covering the operative field. A sterile gown and sterile gloves were used for the procedure. Local anesthesia was provided with 1% Lidocaine.  Ultrasound was performed of the soft tissues of the left medial thigh. After choosing a site for access, an 18 gauge trocar needle was advanced under ultrasound guidance into the collection. Aspiration of fluid was performed and a sample sent for culture analysis. A guidewire was advanced through the needle.  The percutaneous tract was dilated and a 12 French percutaneous drainage catheter placed. Catheter position was confirmed by ultrasound. The catheter was flushed and connected to a suction bulb. It was secured at the skin with a Prolene retention suture and StatLock device.  COMPLICATIONS: None.  FINDINGS: Initial ultrasound shows an elongated, complex fluid collection of the left medial thigh containing mixed echogenicity fluid and tissue. Aspiration yielded grossly purulent fluid. After placement of the drainage catheter, there is very good return of fluid.  IMPRESSION: Ultrasound-guided drainage of left thigh abscess. Aspirated fluid was overtly purulent and a sample sent for culture analysis. A 12 French drain was placed in the collection and connected to  suction bulb drainage.   Electronically Signed   By: Aletta Edouard M.D.   On: 04/15/2014 16:12     Anti-infectives:   Anti-infectives   Start     Dose/Rate Route Frequency Ordered Stop   04/16/14 1000  linezolid (ZYVOX) IVPB 600 mg    Comments:  Pharmacy may adjust time   600 mg 300 mL/hr over 60 Minutes Intravenous Every 12 hours 04/16/14 0818     04/16/14 0800  vancomycin (VANCOCIN) IVPB 1000 mg/200 mL premix  Status:  Discontinued     1,000 mg 200 mL/hr over 60 Minutes Intravenous Every 12 hours 04/15/14 1552 04/16/14 0818   04/15/14 2000   clindamycin (CLEOCIN) IVPB 600 mg  Status:  Discontinued     600 mg 100 mL/hr over 30 Minutes Intravenous Every 6 hours 04/15/14 1833 04/15/14 1933   04/15/14 2000  piperacillin-tazobactam (ZOSYN) IVPB 3.375 g  Status:  Discontinued     3.375 g 12.5 mL/hr over 240 Minutes Intravenous Every 8 hours 04/15/14 1849 04/15/14 1933   04/15/14 2000  linezolid (ZYVOX) tablet 600 mg  Status:  Discontinued     600 mg Oral Every 12 hours 04/15/14 1933 04/16/14 0804   04/15/14 1630  vancomycin (VANCOCIN) 2,250 mg in sodium chloride 0.9 % 500 mL IVPB     2,250 mg 250 mL/hr over 120 Minutes Intravenous  Once 04/15/14 1552 04/15/14 1944      Alphonsa Overall, MD, FACS Pager: [facility] Office: [phone] 04/17/2014

## 2014-04-17 NOTE — Progress Notes (Signed)
   Subjective:     Recheck left thigh abscess Leg with mild pain but overall is better Issues with voiding have improved but pt worried about constipation at this point Denies any numbness or tingling distally Currently being followed by general surgery and ID  Patient reports pain as mild.  Objective:   VITALS:   Filed Vitals:   04/17/14 0605  BP: 144/96  Pulse: 94  Temp: 98 F (36.7 C)  Resp: 18    Left inner thigh drain site in good standing No drainage nv intact distally No rashes or signs of cellulitis distally  LABS  Recent Labs  04/15/14 0438 04/16/14 0739 04/17/14 0440  HGB 13.5 13.6 14.1  HCT 39.8 40.1 41.9  WBC 11.0* 9.7 9.8  PLT 319 280 350     Recent Labs  04/14/14 1623  NA 132*  K 4.0  BUN 17  CREATININE 0.85  GLUCOSE 110*     Assessment/Plan:    left thigh abscess Currently being treated for staph Continue IV antibiotics  WBC continues to improve Will monitor daily      [place], MPAS, PA-C  04/17/2014, 8:16 AM

## 2014-04-18 LAB — CBC WITH DIFFERENTIAL/PLATELET
Basophils Absolute: 0 10*3/uL (ref 0.0–0.1)
Basophils Relative: 0 % (ref 0–1)
Eosinophils Absolute: 0.2 10*3/uL (ref 0.0–0.7)
Eosinophils Relative: 3 % (ref 0–5)
HCT: 43.1 % (ref 39.0–52.0)
Hemoglobin: 14.6 g/dL (ref 13.0–17.0)
Lymphocytes Relative: 20 % (ref 12–46)
Lymphs Abs: 1.4 10*3/uL (ref 0.7–4.0)
MCH: 32.9 pg (ref 26.0–34.0)
MCHC: 33.9 g/dL (ref 30.0–36.0)
MCV: 97.1 fL (ref 78.0–100.0)
Monocytes Absolute: 0.8 10*3/uL (ref 0.1–1.0)
Monocytes Relative: 11 % (ref 3–12)
Neutro Abs: 4.6 10*3/uL (ref 1.7–7.7)
Neutrophils Relative %: 66 % (ref 43–77)
Platelets: 344 10*3/uL (ref 150–400)
RBC: 4.44 MIL/uL (ref 4.22–5.81)
RDW: 14 % (ref 11.5–15.5)
WBC: 7 10*3/uL (ref 4.0–10.5)

## 2014-04-18 LAB — HEPATITIS PANEL, ACUTE
HCV Ab: NEGATIVE
HEP B C IGM: NONREACTIVE
HEP B S AG: NEGATIVE
Hep A IgM: NONREACTIVE

## 2014-04-18 LAB — BASIC METABOLIC PANEL
Anion gap: 12 (ref 5–15)
BUN: 14 mg/dL (ref 6–23)
CALCIUM: 9.2 mg/dL (ref 8.4–10.5)
CO2: 27 mEq/L (ref 19–32)
CREATININE: 0.87 mg/dL (ref 0.50–1.35)
Chloride: 98 mEq/L (ref 96–112)
GFR calc non Af Amer: >90 mL/min (ref >90)
Glucose, Bld: 108 mg/dL — ABNORMAL HIGH (ref 70–99)
Potassium: 4 mEq/L (ref 3.7–5.3)
Sodium: 137 mEq/L (ref 137–147)

## 2014-04-18 LAB — HIV ANTIBODY (ROUTINE TESTING W REFLEX): HIV: NONREACTIVE

## 2014-04-18 NOTE — Evaluation (Signed)
Physical Therapy Evaluation Patient Details Name: Nicholas [name] MRN: [id number] DOB: June 07, 1956 Today's Date: 04/18/2014   History of Present Illness  58 yo male s/p drainage L thigh (adductor) abscess 7/9, R great toe pain/infection, R LE pain.   Clinical Impression  On eval, pt required Min assist for mobility-able to ambulate ~135 feet with rolling walker. Mobility limited by pain R LE worse than L per pt. Encouraged pt to ambulate at least 2-3x/day with nursing/family supervision. Will follow.     Follow Up Recommendations Outpatient PT (once cleared my MD)    Equipment Recommendations   (pt states he will borrow walker from family member)    Recommendations for Other Services       Precautions / Restrictions Precautions Precautions: Fall Restrictions Weight Bearing Restrictions: No      Mobility  Bed Mobility               General bed mobility comments: pt sitting in recliner  Transfers Overall transfer level: Needs assistance Equipment used: Rolling walker (2 wheeled) Transfers: Sit to/from Stand Sit to Stand: Min assist         General transfer comment: Assist to rise, stabilize, control descent. VCS safety, hand placement  Ambulation/Gait Ambulation/Gait assistance: Min guard Ambulation Distance (Feet): 135 Feet Assistive device: Rolling walker (2 wheeled) Gait Pattern/deviations: Decreased stride length;Step-through pattern;Step-to pattern;Antalgic     General Gait Details: slow gait speed. VCs safety, technique, seqeunce. Pt reports LE pain R >L.   Stairs            Wheelchair Mobility    Modified Rankin (Stroke Patients Only)       Balance                                             Pertinent Vitals/Pain 3/10 at rest; 6/10 with activity R>L    Home Living Family/patient expects to be discharged to:: Private residence Living Arrangements: Spouse/significant other   Type of Home: House Home Access: Stairs to  enter Entrance Stairs-Rails: Right Entrance Stairs-Number of Steps: 3 +1 Home Layout: Two level;Bed/bath upstairs Home Equipment: Crutches;Walker - 2 wheels      Prior Function Level of Independence: Independent with assistive device(s)         Comments: was having to use crutches for steps     Hand Dominance        Extremity/Trunk Assessment   Upper Extremity Assessment: Overall WFL for tasks assessed           Lower Extremity Assessment: RLE deficits/detail;LLE deficits/detail RLE Deficits / Details: Knee ext at least 3/5, DF/PF at least 3/5.  LLE Deficits / Details: WFL  Cervical / Trunk Assessment: Normal  Communication   Communication: No difficulties  Cognition Arousal/Alertness: Awake/alert Behavior During Therapy: WFL for tasks assessed/performed Overall Cognitive Status: Within Functional Limits for tasks assessed                      General Comments      Exercises        Assessment/Plan    PT Assessment Patient needs continued PT services  PT Diagnosis Difficulty walking;Abnormality of gait;Generalized weakness;Acute pain   PT Problem List Decreased strength;Decreased range of motion;Decreased activity tolerance;Decreased balance;Decreased mobility;Pain;Decreased knowledge of use of DME  PT Treatment Interventions DME instruction;Gait training;Stair training;Functional mobility training;Therapeutic activities;Therapeutic exercise;Patient/family education   PT  Goals (Current goals can be found in the Care Plan section) Acute Rehab PT Goals Patient Stated Goal: less pain. resume PLOF PT Goal Formulation: With patient Time For Goal Achievement: 05/02/14 Potential to Achieve Goals: Good    Frequency Min 4X/week   Barriers to discharge        Co-evaluation               End of Session Equipment Utilized During Treatment: Gait belt Activity Tolerance: Patient limited by pain Patient left: in chair;with call bell/phone within  reach           Time: 0923-0951 PT Time Calculation (min): 28 min   Charges:   PT Evaluation $Initial PT Evaluation Tier I: 1 Procedure PT Treatments $Gait Training: 23-37 mins   PT G Codes:          Weston Anna, MPT Pager: [phone]

## 2014-04-18 NOTE — Progress Notes (Signed)
   Subjective:     Recheck left leg s/p drain for infection Pt doing fairly well Minimal pain to medial thigh Pt would like to get out of bed and move around some today  Patient reports pain as mild.  Objective:   VITALS:   Filed Vitals:   04/17/14 2140  BP: 145/93  Pulse: 77  Temp: 97.9 F (36.6 C)  Resp: 20    Left leg drain site appropriate nv intact distally Very minimal edema to medial thigh No edema distally No erythema or drainage  LABS  Recent Labs  04/16/14 0739 04/17/14 0440 04/18/14 0537  HGB 13.6 14.1 14.6  HCT 40.1 41.9 43.1  WBC 9.7 9.8 7.0  PLT 280 350 344     Recent Labs  04/18/14 0537  NA 137  K 4.0  BUN 14  CREATININE 0.87  GLUCOSE 108*     Assessment/Plan:    left leg infection with drain placement Plan for d/c home once stable from ID standpoint and conversion to PO antibiotics Will get PT to help him mobilize today per his request Pain control as needed Possible d/c in 1-2 days      Merla Riches, MPAS, PA-C  04/18/2014, 7:00 AM
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
## 2014-04-18 NOTE — Progress Notes (Signed)
General Surgery Note  LOS: 4 days  POD -     Assessment/Plan: 1.  Left thigh abscess - medial portion of the left adductor longus  Muscle - perc drain on 7/9  On Linezolid  Cultures - MRSA  MRI ordered (though by my read of the order - it looks like the target is tib/fib (too low?)) - discussed with nurse to clarify  Is getting better  2.  Infection right great toe - this occurred just before the left thigh infection.  This looks okay.  3.  DVT prophylaxis - Lovenox 4.  On isolation for MRSA 5.  Constipation   Active Problems:   Abscess of left thigh  Subjective:  Seems better.  He still needs to ambulate more than he is. Objective:   Filed Vitals:   04/17/14 2140  BP: 145/93  Pulse: 77  Temp: 97.9 F (36.6 C)  Resp: 20     Intake/Output from previous day:  07/11 0701 - 07/12 0700 In: 3692.5 [P.O.:1440; I.V.:1322.5; IV Piggyback:900] Out: 2135 [Urine:2050; Drains:85]  Intake/Output this shift:      Physical Exam:   General: WN WM who is alert and oriented.    HEENT: Normal. Pupils equal. .   Abdomen: Soft   Wound: drain from medial left thigh.  85 cc recorded from drain yesterday.   Lab Results:     Recent Labs  04/17/14 0440 04/18/14 0537  WBC 9.8 7.0  HGB 14.1 14.6  HCT 41.9 43.1  PLT 350 344    BMET    Recent Labs  04/18/14 0537  NA 137  K 4.0  CL 98  CO2 27  GLUCOSE 108*  BUN 14  CREATININE 0.87  CALCIUM 9.2    PT/INR  No results found for this basename: LABPROT, INR,  in the last 72 hours  ABG  No results found for this basename: PHART, PCO2, PO2, HCO3,  in the last 72 hours   Studies/Results:  No results found.   Anti-infectives:   Anti-infectives   Start     Dose/Rate Route Frequency Ordered Stop   04/16/14 1000  linezolid (ZYVOX) IVPB 600 mg    Comments:  Pharmacy may adjust time   600 mg 300 mL/hr over 60 Minutes Intravenous Every 12 hours 04/16/14 0818     04/16/14 0800  vancomycin (VANCOCIN) IVPB 1000 mg/200 mL  premix  Status:  Discontinued     1,000 mg 200 mL/hr over 60 Minutes Intravenous Every 12 hours 04/15/14 1552 04/16/14 0818   04/15/14 2000  clindamycin (CLEOCIN) IVPB 600 mg  Status:  Discontinued     600 mg 100 mL/hr over 30 Minutes Intravenous Every 6 hours 04/15/14 1833 04/15/14 1933   04/15/14 2000  piperacillin-tazobactam (ZOSYN) IVPB 3.375 g  Status:  Discontinued     3.375 g 12.5 mL/hr over 240 Minutes Intravenous Every 8 hours 04/15/14 1849 04/15/14 1933   04/15/14 2000  linezolid (ZYVOX) tablet 600 mg  Status:  Discontinued     600 mg Oral Every 12 hours 04/15/14 1933 04/16/14 0804   04/15/14 1630  vancomycin (VANCOCIN) 2,250 mg in sodium chloride 0.9 % 500 mL IVPB     2,250 mg 250 mL/hr over 120 Minutes Intravenous  Once 04/15/14 1552 04/15/14 1944      Alphonsa Overall, MD, FACS Pager: [facility] Office: [phone] 04/18/2014

## 2014-04-19 ENCOUNTER — Inpatient Hospital Stay (HOSPITAL_COMMUNITY): Payer: BC Managed Care – PPO

## 2014-04-19 DIAGNOSIS — L03119 Cellulitis of unspecified part of limb: Principal | ICD-10-CM

## 2014-04-19 DIAGNOSIS — A4902 Methicillin resistant Staphylococcus aureus infection, unspecified site: Secondary | ICD-10-CM

## 2014-04-19 DIAGNOSIS — L02419 Cutaneous abscess of limb, unspecified: Principal | ICD-10-CM

## 2014-04-19 DIAGNOSIS — M7989 Other specified soft tissue disorders: Secondary | ICD-10-CM

## 2014-04-19 DIAGNOSIS — M60009 Infective myositis, unspecified site: Secondary | ICD-10-CM

## 2014-04-19 LAB — CBC WITH DIFFERENTIAL/PLATELET
Basophils Absolute: 0 10*3/uL (ref 0.0–0.1)
Basophils Relative: 1 % (ref 0–1)
Eosinophils Absolute: 0.3 10*3/uL (ref 0.0–0.7)
Eosinophils Relative: 4 % (ref 0–5)
HCT: 45.6 % (ref 39.0–52.0)
Hemoglobin: 15.1 g/dL (ref 13.0–17.0)
Lymphocytes Relative: 21 % (ref 12–46)
Lymphs Abs: 1.6 10*3/uL (ref 0.7–4.0)
MCH: 32.2 pg (ref 26.0–34.0)
MCHC: 33.1 g/dL (ref 30.0–36.0)
MCV: 97.2 fL (ref 78.0–100.0)
Monocytes Absolute: 0.8 10*3/uL (ref 0.1–1.0)
Monocytes Relative: 11 % (ref 3–12)
Neutro Abs: 4.7 10*3/uL (ref 1.7–7.7)
Neutrophils Relative %: 63 % (ref 43–77)
Platelets: 389 10*3/uL (ref 150–400)
RBC: 4.69 MIL/uL (ref 4.22–5.81)
RDW: 14 % (ref 11.5–15.5)
WBC: 7.4 10*3/uL (ref 4.0–10.5)

## 2014-04-19 MED ORDER — LINEZOLID 600 MG PO TABS
600.0000 mg | ORAL_TABLET | Freq: Two times a day (BID) | ORAL | Status: DC
  Administered 2014-04-19 – 2014-04-21 (×5): 600 mg via ORAL
  Filled 2014-04-19 (×6): qty 1

## 2014-04-19 MED ORDER — FLEET ENEMA 7-19 GM/118ML RE ENEM
1.0000 | ENEMA | Freq: Every day | RECTAL | Status: DC | PRN
  Administered 2014-04-20: 1 via RECTAL
  Filled 2014-04-19: qty 1

## 2014-04-19 MED ORDER — GADOBENATE DIMEGLUMINE 529 MG/ML IV SOLN
20.0000 mL | Freq: Once | INTRAVENOUS | Status: AC | PRN
  Administered 2014-04-19: 20 mL via INTRAVENOUS

## 2014-04-19 NOTE — Progress Notes (Signed)
Subjective: Doing much better today. WBC has remarkably decreased and he is afebrile. Clinically his thigh looks so much better. Will do MRI both Thighs and greattoe. His toe does look better. I did aspirate his great toe in the ER on admission and he had a small amount of blood only.   Objective: Vital signs in last 24 hours: Temp:  [97.6 F (36.4 C)-98.4 F (36.9 C)] 98.4 F (36.9 C) (07/13 9622) Pulse Rate:  [83-103] 83 (07/13 0613) Resp:  [16-20] 16 (07/13 0613) BP: (130-154)/(85-98) 154/98 mmHg (07/13 0613) SpO2:  [93 %-99 %] 93 % (07/13 0613)  Intake/Output from previous day: 07/12 0701 - 07/13 0700 In: 2150 [P.O.:1080; I.V.:750; IV Piggyback:300] Out: 77 [Drains:77] Intake/Output this shift:     Recent Labs  04/16/14 0739 04/17/14 0440 04/18/14 0537 04/19/14 0545  HGB 13.6 14.1 14.6 15.1    Recent Labs  04/18/14 0537 04/19/14 0545  WBC 7.0 7.4  RBC 4.44 4.69  HCT 43.1 45.6  PLT 344 389    Recent Labs  04/18/14 0537  NA 137  K 4.0  CL 98  CO2 27  BUN 14  CREATININE 0.87  GLUCOSE 108*  CALCIUM 9.2   No results found for this basename: LABPT, INR,  in the last 72 hours  Compartment soft His left thigh is remarkably improved. He has minimal pain in Right thigh but no signs of an infection.  Assessment/Plan: MRI of both thighs and Right Great Toe.   Shulamit Donofrio A 04/19/2014, 7:15 AM

## 2014-04-19 NOTE — Progress Notes (Signed)
Pharmacy - potential drug-drug interaction.  The concurrent use of cyclobenzaprine and linezolid may increase the likelihood of serotonin syndrome, hyperpyretic crisis, severe seizures, and death.   Mechanism: cyclobenzaprine is a tricyclic amine structurally related to tricyclic antidepressants. Though the mechanism of action is unknown, it is likely that adrenergic activity is enhanced with concurrent administration.  The patient has been tolerating Zyvox and ~20mg /day of prn cyclobenzaprine which is a home medication. I discussed this issue with Dr. Tommy Medal.   Recommendation: Continue monitoring for signs of serotonin syndrome while inpatient. If Zyvox is to be continued upon discharge, consider switching cyclobenzprine to an alternative muscle relaxer to avoid this potential interaction. I.e. Robaxin or Skelaxin.  Romeo Rabon, PharmD, pager [phone]. 04/19/2014,9:02 AM.

## 2014-04-19 NOTE — Progress Notes (Signed)
MRI called concerning JP drain and asked about a metal tip. Nurse referred them to IR because it was placed in that department. Nurse unable to find definitive insertion information for drain.

## 2014-04-19 NOTE — Progress Notes (Signed)
INFECTIOUS DISEASE PROGRESS NOTE  ID: Nicholas [name] is a 58 y.o. male with  Active Problems:   Abscess of left thigh  Subjective: Some continued back pain, thigh swelling, tenderness R great toe.   Abtx:  Anti-infectives   Start     Dose/Rate Route Frequency Ordered Stop   04/19/14 1000  linezolid (ZYVOX) tablet 600 mg     600 mg Oral Every 12 hours 04/19/14 0837     04/16/14 1000  linezolid (ZYVOX) IVPB 600 mg  Status:  Discontinued    Comments:  Pharmacy may adjust time   600 mg 300 mL/hr over 60 Minutes Intravenous Every 12 hours 04/16/14 0818 04/19/14 0837   04/16/14 0800  vancomycin (VANCOCIN) IVPB 1000 mg/200 mL premix  Status:  Discontinued     1,000 mg 200 mL/hr over 60 Minutes Intravenous Every 12 hours 04/15/14 1552 04/16/14 0818   04/15/14 2000  clindamycin (CLEOCIN) IVPB 600 mg  Status:  Discontinued     600 mg 100 mL/hr over 30 Minutes Intravenous Every 6 hours 04/15/14 1833 04/15/14 1933   04/15/14 2000  piperacillin-tazobactam (ZOSYN) IVPB 3.375 g  Status:  Discontinued     3.375 g 12.5 mL/hr over 240 Minutes Intravenous Every 8 hours 04/15/14 1849 04/15/14 1933   04/15/14 2000  linezolid (ZYVOX) tablet 600 mg  Status:  Discontinued     600 mg Oral Every 12 hours 04/15/14 1933 04/16/14 0804   04/15/14 1630  vancomycin (VANCOCIN) 2,250 mg in sodium chloride 0.9 % 500 mL IVPB     2,250 mg 250 mL/hr over 120 Minutes Intravenous  Once 04/15/14 1552 04/15/14 1944      Medications:  Scheduled: . docusate sodium  100 mg Oral BID  . enoxaparin (LOVENOX) injection  40 mg Subcutaneous Q24H  . linezolid  600 mg Oral Q12H  . mupirocin ointment  1 application Nasal BID  . polyethylene glycol  17 g Oral Daily  . sodium chloride  3 mL Intravenous Q12H    Objective: Vital signs in last 24 hours: Temp:  [97.6 F (36.4 C)-98.4 F (36.9 C)] 98.4 F (36.9 C) (07/13 7353) Pulse Rate:  [83-99] 83 (07/13 0613) Resp:  [16-20] 16 (07/13 0613) BP: (151-154)/(91-98) 154/98  mmHg (07/13 0613) SpO2:  [93 %-99 %] 93 % (07/13 0613)   General appearance: alert, cooperative and no distress Back: no tenderness to percussion or palpation, symmetric, no curvature. ROM normal. No CVA tenderness. Extremities: swelling L thigh, discoloration/swelling R great toe. tender.   Lab Results  Recent Labs  04/18/14 0537 04/19/14 0545  WBC 7.0 7.4  HGB 14.6 15.1  HCT 43.1 45.6  NA 137  --   K 4.0  --   CL 98  --   CO2 27  --   BUN 14  --   CREATININE 0.87  --    Liver Panel No results found for this basename: PROT, ALBUMIN, AST, ALT, ALKPHOS, BILITOT, BILIDIR, IBILI,  in the last 72 hours Sedimentation Rate No results found for this basename: ESRSEDRATE,  in the last 72 hours C-Reactive Protein No results found for this basename: CRP,  in the last 72 hours  Microbiology: Recent Results (from the past 240 hour(s))  SURGICAL PCR SCREEN     Status: Abnormal   Collection Time    04/14/14  8:36 PM      Result Value Ref Range Status   MRSA, PCR POSITIVE (*) NEGATIVE Final   Comment: RESULT CALLED TO, READ BACK BY  AND VERIFIED WITH:     J.CRUTCHFIELD,RN AT 2252 ON 04/14/14 BY WSHEA   Staphylococcus aureus POSITIVE (*) NEGATIVE Final   Comment:            The Xpert SA Assay (FDA     approved for NASAL specimens     in patients over 84 years of age),     is one component of     a comprehensive surveillance     program.  Test performance has     been validated by [employer] for patients greater     than or equal to 47 year old.     It is not intended     to diagnose infection nor to     guide or monitor treatment.  ANAEROBIC CULTURE     Status: None   Collection Time    04/15/14  3:00 PM      Result Value Ref Range Status   Specimen Description ABSCESS LEFT THIGH   Final   Special Requests NONE   Final   Gram Stain     Final   Value: WBC PRESENT,BOTH PMN AND MONONUCLEAR     SQUAMOUS EPITHELIAL CELLS PRESENT     GRAM POSITIVE COCCI     Performed at  [facility]   Culture     Final   Value: NO ANAEROBES ISOLATED; CULTURE IN PROGRESS FOR 5 DAYS     Performed at [facility]   Report Status PENDING   Incomplete  CULTURE, ROUTINE-ABSCESS     Status: None   Collection Time    04/15/14  3:00 PM      Result Value Ref Range Status   Specimen Description ABSCESS LEFT THIGH   Final   Special Requests NONE   Final   Gram Stain     Final   Value: WBC PRESENT,BOTH PMN AND MONONUCLEAR     SQUAMOUS EPITHELIAL CELLS PRESENT     GRAM POSITIVE COCCI     Performed by [facility] CYTOSPIN     Performed at [facility]   Culture     Final   Value: ABUNDANT METHICILLIN RESISTANT STAPHYLOCOCCUS AUREUS     Note: RIFAMPIN AND GENTAMICIN SHOULD NOT BE USED AS SINGLE DRUGS FOR TREATMENT OF STAPH INFECTIONS. This organism DOES NOT demonstrate inducible Clindamycin resistance in vitro. CRITICAL RESULT CALLED TO, READ BACK BY AND VERIFIED WITH: R.BELL 7/11 @1045       BY REAMM     Note: Gram Stain Report Called to,Read Back By and Verified With: Sherrin Daisy RN [id number] 04/15/14 A NAVARRO     Performed at [facility]   Report Status 04/17/2014 FINAL   Final   Organism ID, Bacteria METHICILLIN RESISTANT STAPHYLOCOCCUS AUREUS   Final  GRAM STAIN     Status: None   Collection Time    04/15/14  3:00 PM      Result Value Ref Range Status   Specimen Description ABSCESS LEFT THIGH   Final   Special Requests NONE   Final   Gram Stain     Final   Value: CYTOSPIN     WBC PRESENT,BOTH PMN AND MONONUCLEAR     GRAM POSITIVE COCCI     Gram Stain Report Called to,Read Back By and Verified WithSherrin Daisy RN [id number] 04/15/14 A NAVARRO   Report Status 04/15/2014 FINAL   Final    Studies/Results: No results found.   Assessment/Plan: MRSA Pyomyositis/Abscess L  thigh L great toe swelling  MRIs pending No change in anbx for now PLT stable Possible change to doxy at d/c  Total days of antibiotics: 4 zyvox (po)          Bobby Rumpf Infectious Diseases (pager) [phone] [email or web address] 04/19/2014, 3:21 PM  LOS: 5 days   **Disclaimer: This note may have been dictated with voice recognition software. Similar sounding words can inadvertently be transcribed and this note may contain transcription errors which may not have been corrected upon publication of note.**

## 2014-04-19 NOTE — Progress Notes (Signed)
Patient interviewed and examined, agree with PA note above.  Edward Jolly MD, FACS  04/19/2014 10:51 AM

## 2014-04-19 NOTE — Progress Notes (Addendum)
Physical Therapy Treatment Patient Details Name: Nicholas [name] MRN: [id number] DOB: 05/06/1956 Today's Date: 04/19/2014    History of Present Illness 58 yo male s/p drainage L thigh (adductor) abscess 7/9, R great toe pain/infection, R LE pain.     PT Comments    Pt still present with now B inner thigh pain.  Assisted OOB with increased time and support B LE's.  Also required elevated bed and increased time to stand.  "Very sore/tender". Amb short distance to BR then amb in hallway.  Pt required increased time and placed excessive pressure on RW.  Returned to room and positioned in recliner.  Pt plans to have a MRI this afternoon.    Follow Up Recommendations  Outpatient PT     Equipment Recommendations       Recommendations for Other Services       Precautions / Restrictions Precautions Precautions: Fall Restrictions Weight Bearing Restrictions: No    Mobility  Bed Mobility Overal bed mobility: Needs Assistance Bed Mobility: Supine to Sit           General bed mobility comments: Min Assist to support b LE's off bed plus increased time due to pain in B thighs  Transfers Overall transfer level: Needs assistance Equipment used: Rolling walker (2 wheeled) Transfers: Sit to/from Stand Sit to Stand: Min assist;Min guard         General transfer comment: Assist to rise, stabilize, control descent. VCS safety, hand placement plus increased time  Ambulation/Gait Ambulation/Gait assistance: Min guard Ambulation Distance (Feet): 148 Feet Assistive device: Rolling walker (2 wheeled) Gait Pattern/deviations: Step-through pattern;Decreased stride length Gait velocity: decreased   General Gait Details: slow gait speed. VCs safety, technique, seqeunce. Pt reports LE pain R >L.    Stairs            Wheelchair Mobility    Modified Rankin (Stroke Patients Only)       Balance                                    Cognition                             Exercises      General Comments        Pertinent Vitals/Pain C/o 6/10 B thigh pain from "hip to top of the knee (inner thigh)"    Home Living                      Prior Function            PT Goals (current goals can now be found in the care plan section) Progress towards PT goals: Progressing toward goals    Frequency  Min 4X/week    PT Plan      Co-evaluation             End of Session Equipment Utilized During Treatment: Gait belt Activity Tolerance: Patient limited by pain Patient left: in chair;with call bell/phone within reach     Time: 1120-1144 PT Time Calculation (min): 24 min  Charges:  $Gait Training: 8-22 mins $Therapeutic Activity: 8-22 mins                    G Codes:      Rica Koyanagi  PTA [facility]  Acute  Rehab Pager  [phone]  

## 2014-04-19 NOTE — Progress Notes (Signed)
[facility] Progress Note     Subjective: Pt doing well.  Says he thinks there has been some increased hardness to the left medial thigh.  His drain is draining pink fluid.  Tolerating diet.  Pending MRI's.    Objective: Vital signs in last 24 hours: Temp:  [97.6 F (36.4 C)-98.4 F (36.9 C)] 98.4 F (36.9 C) (07/13 0254) Pulse Rate:  [83-103] 83 (07/13 0613) Resp:  [16-20] 16 (07/13 0613) BP: (130-154)/(85-98) 154/98 mmHg (07/13 0613) SpO2:  [93 %-99 %] 93 % (07/13 2706) Last BM Date: 04/05/14  Intake/Output from previous day: 07/12 0701 - 07/13 0700 In: 2150 [P.O.:1080; I.V.:750; IV Piggyback:300] Out: 77 [Drains:77] Intake/Output this shift:    PE: Gen:  Alert, NAD, pleasant Ext:  Left thigh induration/erythema to medial thigh spreads to knee and up to groin crease, drain in place with serosanguinous drainage, slight TTP.  Right thigh without erythema, induration or fluctuance.  Left 1st digit erythematous with induration from interphalangeal joint to distal toe.     Lab Results:   Recent Labs  04/18/14 0537 04/19/14 0545  WBC 7.0 7.4  HGB 14.6 15.1  HCT 43.1 45.6  PLT 344 389   BMET  Recent Labs  04/18/14 0537  NA 137  K 4.0  CL 98  CO2 27  GLUCOSE 108*  BUN 14  CREATININE 0.87  CALCIUM 9.2   PT/INR No results found for this basename: LABPROT, INR,  in the last 72 hours CMP     Component Value Date/Time   NA 137 04/18/2014 0537   K 4.0 04/18/2014 0537   CL 98 04/18/2014 0537   CO2 27 04/18/2014 0537   GLUCOSE 108* 04/18/2014 0537   BUN 14 04/18/2014 0537   CREATININE 0.87 04/18/2014 0537   CALCIUM 9.2 04/18/2014 0537   GFRNONAA >90 04/18/2014 0537   GFRAA >90 04/18/2014 0537   Lipase  No results found for this basename: lipase       Studies/Results: No results found.  Anti-infectives: Anti-infectives   Start     Dose/Rate Route Frequency Ordered Stop   04/16/14 1000  linezolid (ZYVOX) IVPB 600 mg    Comments:  Pharmacy may  adjust time   600 mg 300 mL/hr over 60 Minutes Intravenous Every 12 hours 04/16/14 0818     04/16/14 0800  vancomycin (VANCOCIN) IVPB 1000 mg/200 mL premix  Status:  Discontinued     1,000 mg 200 mL/hr over 60 Minutes Intravenous Every 12 hours 04/15/14 1552 04/16/14 0818   04/15/14 2000  clindamycin (CLEOCIN) IVPB 600 mg  Status:  Discontinued     600 mg 100 mL/hr over 30 Minutes Intravenous Every 6 hours 04/15/14 1833 04/15/14 1933   04/15/14 2000  piperacillin-tazobactam (ZOSYN) IVPB 3.375 g  Status:  Discontinued     3.375 g 12.5 mL/hr over 240 Minutes Intravenous Every 8 hours 04/15/14 1849 04/15/14 1933   04/15/14 2000  linezolid (ZYVOX) tablet 600 mg  Status:  Discontinued     600 mg Oral Every 12 hours 04/15/14 1933 04/16/14 0804   04/15/14 1630  vancomycin (VANCOCIN) 2,250 mg in sodium chloride 0.9 % 500 mL IVPB     2,250 mg 250 mL/hr over 120 Minutes Intravenous  Once 04/15/14 1552 04/15/14 1944       Assessment/Plan 1. Left thigh abscess - medial portion of the left adductor longus   Muscle - perc drain on 7/9   On Linezolid - abx per ID  Cultures - MRSA  MRI pending b/l thighs  Ortho is on board, pending MRI's, may need debridement but this would usually be done by ortho given location to muscle tissue, would defer most likely to their expertise.   2. Infection right great toe   MRI pending right 1st digit 3. DVT prophylaxis - Lovenox  4. On isolation for MRSA  5. Constipation     LOS: 5 days    Coralie Keens 04/19/2014, 7:35 AM Pager: [phone]

## 2014-04-20 DIAGNOSIS — L02415 Cutaneous abscess of right lower limb: Secondary | ICD-10-CM | POA: Diagnosis not present

## 2014-04-20 LAB — CBC WITH DIFFERENTIAL/PLATELET
Basophils Absolute: 0 10*3/uL (ref 0.0–0.1)
Basophils Relative: 0 % (ref 0–1)
Eosinophils Absolute: 0.2 10*3/uL (ref 0.0–0.7)
Eosinophils Relative: 4 % (ref 0–5)
HCT: 41.7 % (ref 39.0–52.0)
Hemoglobin: 14 g/dL (ref 13.0–17.0)
Lymphocytes Relative: 23 % (ref 12–46)
Lymphs Abs: 1.3 10*3/uL (ref 0.7–4.0)
MCH: 32.6 pg (ref 26.0–34.0)
MCHC: 33.6 g/dL (ref 30.0–36.0)
MCV: 97.2 fL (ref 78.0–100.0)
Monocytes Absolute: 0.8 10*3/uL (ref 0.1–1.0)
Monocytes Relative: 14 % — ABNORMAL HIGH (ref 3–12)
Neutro Abs: 3.3 10*3/uL (ref 1.7–7.7)
Neutrophils Relative %: 59 % (ref 43–77)
Platelets: 332 10*3/uL (ref 150–400)
RBC: 4.29 MIL/uL (ref 4.22–5.81)
RDW: 13.9 % (ref 11.5–15.5)
WBC: 5.6 10*3/uL (ref 4.0–10.5)

## 2014-04-20 LAB — ANAEROBIC CULTURE

## 2014-04-20 LAB — SEDIMENTATION RATE: Sed Rate: 54 mm/hr — ABNORMAL HIGH (ref 0–16)

## 2014-04-20 LAB — C-REACTIVE PROTEIN: CRP: 5 mg/dL — ABNORMAL HIGH (ref <0.60)

## 2014-04-20 NOTE — Progress Notes (Signed)
INFECTIOUS DISEASE PROGRESS NOTE  ID: Nicholas [name] is a 58 y.o. male with  Active Problems:   Abscess of left thigh   Abscess of right thigh  Subjective: Without complaints Feels like drain from his leg is decreasing.   Abtx:  Anti-infectives   Start     Dose/Rate Route Frequency Ordered Stop   04/19/14 1000  linezolid (ZYVOX) tablet 600 mg     600 mg Oral Every 12 hours 04/19/14 0837     04/16/14 1000  linezolid (ZYVOX) IVPB 600 mg  Status:  Discontinued    Comments:  Pharmacy may adjust time   600 mg 300 mL/hr over 60 Minutes Intravenous Every 12 hours 04/16/14 0818 04/19/14 0837   04/16/14 0800  vancomycin (VANCOCIN) IVPB 1000 mg/200 mL premix  Status:  Discontinued     1,000 mg 200 mL/hr over 60 Minutes Intravenous Every 12 hours 04/15/14 1552 04/16/14 0818   04/15/14 2000  clindamycin (CLEOCIN) IVPB 600 mg  Status:  Discontinued     600 mg 100 mL/hr over 30 Minutes Intravenous Every 6 hours 04/15/14 1833 04/15/14 1933   04/15/14 2000  piperacillin-tazobactam (ZOSYN) IVPB 3.375 g  Status:  Discontinued     3.375 g 12.5 mL/hr over 240 Minutes Intravenous Every 8 hours 04/15/14 1849 04/15/14 1933   04/15/14 2000  linezolid (ZYVOX) tablet 600 mg  Status:  Discontinued     600 mg Oral Every 12 hours 04/15/14 1933 04/16/14 0804   04/15/14 1630  vancomycin (VANCOCIN) 2,250 mg in sodium chloride 0.9 % 500 mL IVPB     2,250 mg 250 mL/hr over 120 Minutes Intravenous  Once 04/15/14 1552 04/15/14 1944      Medications:  Scheduled: . docusate sodium  100 mg Oral BID  . enoxaparin (LOVENOX) injection  40 mg Subcutaneous Q24H  . linezolid  600 mg Oral Q12H  . polyethylene glycol  17 g Oral Daily  . sodium chloride  3 mL Intravenous Q12H    Objective: Vital signs in last 24 hours: Temp:  [98.2 F (36.8 C)] 98.2 F (36.8 C) (07/14 0549) Pulse Rate:  [73-114] 73 (07/14 0549) Resp:  [14-16] 14 (07/14 0549) BP: (159-174)/(92-97) 159/97 mmHg (07/14 0549) SpO2:  [96 %-97 %] 97  % (07/14 0549)   General appearance: alert, cooperative and no distress Extremities: swelling L thigh and drain in place. swelling L ankle/calf.   Lab Results  Recent Labs  04/18/14 0537 04/19/14 0545 04/20/14 0445  WBC 7.0 7.4 5.6  HGB 14.6 15.1 14.0  HCT 43.1 45.6 41.7  NA 137  --   --   K 4.0  --   --   CL 98  --   --   CO2 27  --   --   BUN 14  --   --   CREATININE 0.87  --   --    Liver Panel No results found for this basename: PROT, ALBUMIN, AST, ALT, ALKPHOS, BILITOT, BILIDIR, IBILI,  in the last 72 hours Sedimentation Rate  Recent Labs  04/20/14 0740  ESRSEDRATE 54*   C-Reactive Protein  Recent Labs  04/20/14 0740  CRP 5.0*    Microbiology: Recent Results (from the past 240 hour(s))  SURGICAL PCR SCREEN     Status: Abnormal   Collection Time    04/14/14  8:36 PM      Result Value Ref Range Status   MRSA, PCR POSITIVE (*) NEGATIVE Final   Comment: RESULT CALLED TO, READ BACK BY  AND VERIFIED WITH:     J.CRUTCHFIELD,RN AT 2252 ON 04/14/14 BY WSHEA   Staphylococcus aureus POSITIVE (*) NEGATIVE Final   Comment:            The Xpert SA Assay (FDA     approved for NASAL specimens     in patients over 72 years of age),     is one component of     a comprehensive surveillance     program.  Test performance has     been validated by [employer] for patients greater     than or equal to 35 year old.     It is not intended     to diagnose infection nor to     guide or monitor treatment.  ANAEROBIC CULTURE     Status: None   Collection Time    04/15/14  3:00 PM      Result Value Ref Range Status   Specimen Description ABSCESS LEFT THIGH   Final   Special Requests NONE   Final   Gram Stain     Final   Value: WBC PRESENT,BOTH PMN AND MONONUCLEAR     SQUAMOUS EPITHELIAL CELLS PRESENT     GRAM POSITIVE COCCI     Performed at [facility]   Culture     Final   Value: NO ANAEROBES ISOLATED; CULTURE IN PROGRESS FOR 5 DAYS     Performed at  [facility]   Report Status PENDING   Incomplete  CULTURE, ROUTINE-ABSCESS     Status: None   Collection Time    04/15/14  3:00 PM      Result Value Ref Range Status   Specimen Description ABSCESS LEFT THIGH   Final   Special Requests NONE   Final   Gram Stain     Final   Value: WBC PRESENT,BOTH PMN AND MONONUCLEAR     SQUAMOUS EPITHELIAL CELLS PRESENT     GRAM POSITIVE COCCI     Performed by [facility] CYTOSPIN     Performed at [facility]   Culture     Final   Value: ABUNDANT METHICILLIN RESISTANT STAPHYLOCOCCUS AUREUS     Note: RIFAMPIN AND GENTAMICIN SHOULD NOT BE USED AS SINGLE DRUGS FOR TREATMENT OF STAPH INFECTIONS. This organism DOES NOT demonstrate inducible Clindamycin resistance in vitro. CRITICAL RESULT CALLED TO, READ BACK BY AND VERIFIED WITH: R.BELL 7/11 @1045       BY REAMM     Note: Gram Stain Report Called to,Read Back By and Verified With: Sherrin Daisy RN [id number] 04/15/14 A NAVARRO     Performed at [facility]   Report Status 04/17/2014 FINAL   Final   Organism ID, Bacteria METHICILLIN RESISTANT STAPHYLOCOCCUS AUREUS   Final  GRAM STAIN     Status: None   Collection Time    04/15/14  3:00 PM      Result Value Ref Range Status   Specimen Description ABSCESS LEFT THIGH   Final   Special Requests NONE   Final   Gram Stain     Final   Value: CYTOSPIN     WBC PRESENT,BOTH PMN AND MONONUCLEAR     GRAM POSITIVE COCCI     Gram Stain Report Called to,Read Back By and Verified WithSherrin Daisy RN 1950 04/15/14 A NAVARRO   Report Status 04/15/2014 FINAL   Final    Studies/Results: Mr Foot Right Wo Contrast  04/19/2014  CLINICAL DATA:  Right great toe tenderness.  EXAM: MRI OF THE RIGHT FOREFOOT WITHOUT CONTRAST  TECHNIQUE: Multiplanar, multisequence MR imaging was performed. No intravenous contrast was administered.  COMPARISON:  Plain film 04/14/2014.  FINDINGS: There is no bone marrow signal abnormality suggest osteomyelitis. There is no focal  fluid collection. Minimal subcutaneous edema is present over the dorsum of the foot. No notable arthropathy is seen. Intrinsic musculature and all visualized tendons appear normal. There is no mass.  IMPRESSION: Negative for infectious process. No acute finding. Minimal subcutaneous edema over the dorsum of the foot noted.   Electronically Signed   By: Inge Rise M.D.   On: 04/19/2014 17:15   Mr Femur Right W Wo Contrast  04/19/2014   CLINICAL DATA:  Evaluate thigh abscess  EXAM: MR OF THE LEFT LOWER EXTREMITY WITHOUT AND WITH CONTRAST; MRI OF THE RIGHT FEMUR WITHOUT AND WITH CONTRAST  TECHNIQUE: Multiplanar, multisequence MR imaging of the lower left extremity was performed both before and after administration of intravenous contrast.  CONTRAST:  29mL MULTIHANCE GADOBENATE DIMEGLUMINE 529 MG/ML IV SOLN  COMPARISON:  None.  FINDINGS: There is severe muscle edema within the right obturator externus muscle with a peripherally enhancing focal fluid collection measuring 2.4 x 1.2 cm within the obturator externus muscle.  There is muscle edema within the medial aspect of the left adductor longus with enhancement. There is a peripherally enhancing fluid collection superficial to the muscle measuring approximately 2.9 x 1.5 x 7 cm with a crescentic appearance.  There is subcutaneous edema and skin thickening involving the medial aspect of the upper of thighs bilaterally, left greater than right without significant enhancement.  The remainder of the muscles are normal in signal. There is no other fluid collection or hematoma. There is no soft tissue mass. The marrow signal is normal throughout the visualized femurs.  IMPRESSION: 1. Severe enhancing muscle with edema within the right obturator externus muscle with a peripherally enhancing intramuscular 2.4 x 1.2 cm fluid collection most concerning for infectious myositis with a small abscess. 2. Edema within the medial aspect of the left adductor longus with  enhancement and a peripherally enhancing fluid collection superficial to the muscle measuring 2.9 x 1.5 x 7cm cm also concerning for infectious myositis with a small abscess along the medial fascia.   Electronically Signed   By: Kathreen Devoid   On: 04/19/2014 22:03   Mr Femur Left W Wo Contrast  04/19/2014   CLINICAL DATA:  Evaluate thigh abscess  EXAM: MR OF THE LEFT LOWER EXTREMITY WITHOUT AND WITH CONTRAST; MRI OF THE RIGHT FEMUR WITHOUT AND WITH CONTRAST  TECHNIQUE: Multiplanar, multisequence MR imaging of the lower left extremity was performed both before and after administration of intravenous contrast.  CONTRAST:  60mL MULTIHANCE GADOBENATE DIMEGLUMINE 529 MG/ML IV SOLN  COMPARISON:  None.  FINDINGS: There is severe muscle edema within the right obturator externus muscle with a peripherally enhancing focal fluid collection measuring 2.4 x 1.2 cm within the obturator externus muscle.  There is muscle edema within the medial aspect of the left adductor longus with enhancement. There is a peripherally enhancing fluid collection superficial to the muscle measuring approximately 2.9 x 1.5 x 7 cm with a crescentic appearance.  There is subcutaneous edema and skin thickening involving the medial aspect of the upper of thighs bilaterally, left greater than right without significant enhancement.  The remainder of the muscles are normal in signal. There is no other fluid collection or hematoma. There is  no soft tissue mass. The marrow signal is normal throughout the visualized femurs.  IMPRESSION: 1. Severe enhancing muscle with edema within the right obturator externus muscle with a peripherally enhancing intramuscular 2.4 x 1.2 cm fluid collection most concerning for infectious myositis with a small abscess. 2. Edema within the medial aspect of the left adductor longus with enhancement and a peripherally enhancing fluid collection superficial to the muscle measuring 2.9 x 1.5 x 7cm cm also concerning for infectious  myositis with a small abscess along the medial fascia.   Electronically Signed   By: Kathreen Devoid   On: 04/19/2014 22:03     Assessment/Plan: MRSA Pyomyositis/Abscess L and R legs  L great toe swelling- no infection on MRI  Total days of antibiotics: 5 zyvox (po)       He is doing well. If surgery does not feel he needs further drainage, agree with d/c.  He should continue on po zyvox at least until he has f/u in ID clinic in 2 weeks, would suggest repeat MRI at that time.  Defer to IR regarding removal of drain. He is given general recommendations about staph carriage (hand washing, antibacterial soaps, keeping wounds covered, not sharing linens).  available as needed.     Bobby Rumpf Infectious Diseases (pager) [phone] [email or web address] 04/20/2014, 12:12 PM  LOS: 6 days   **Disclaimer: This note may have been dictated with voice recognition software. Similar sounding words can inadvertently be transcribed and this note may contain transcription errors which may not have been corrected upon publication of note.**

## 2014-04-20 NOTE — Progress Notes (Signed)
Physical Therapy Treatment Patient Details Name: Nicholas [name] MRN: [id number] DOB: 1956-04-13 Today's Date: 04/20/2014    History of Present Illness 58 yo male s/p drainage L thigh (adductor) abscess 7/9, R great toe pain/infection, R LE pain.     PT Comments    Pt planning for OPPT at D/C; He is going to borrow his mother's RW; will have support of spouse; discussed stairs, pt was using crutches to go up down entry stairs and plans same method, discussed options and looked at knee/hip flexion-which has improved and pt feels he will be able to manage;  Follow Up Recommendations  Outpatient PT     Equipment Recommendations  Other (comment) (plans to  borrow RW, has crutches)    Recommendations for Other Services       Precautions / Restrictions Precautions Precautions: Fall    Mobility  Bed Mobility Overal bed mobility: Needs Assistance Bed Mobility: Supine to Sit           General bed mobility comments: Min Assist to support b LE's off bed plus increased time due to pain in B thighs  Transfers Overall transfer level: Needs assistance Equipment used: Rolling walker (2 wheeled) Transfers: Sit to/from Stand Sit to Stand: Min assist;Min guard         General transfer comment: Assist to rise, stabilize, control descent. VCS safety, hand placement plus increased time  Ambulation/Gait Ambulation/Gait assistance: Supervision Ambulation Distance (Feet): 280 Feet Assistive device: Rolling walker (2 wheeled) Gait Pattern/deviations: Step-to pattern;Step-through pattern     General Gait Details: pt able to incr WBing on LEs today although still dependent on RW/UEs   Stairs            Wheelchair Mobility    Modified Rankin (Stroke Patients Only)       Balance Overall balance assessment: Needs assistance         Standing balance support: Single extremity supported;Bilateral upper extremity supported Standing balance-Leahy Scale: Poor                 High Level Balance Comments: requires UE support due to LE pain    Cognition Arousal/Alertness: Awake/alert Behavior During Therapy: WFL for tasks assessed/performed Overall Cognitive Status: Within Functional Limits for tasks assessed                      Exercises      General Comments        Pertinent Vitals/Pain Pain "better"    Home Living                      Prior Function            PT Goals (current goals can now be found in the care plan section) Acute Rehab PT Goals Patient Stated Goal: less pain. resume PLOF PT Goal Formulation: With patient Time For Goal Achievement: 05/02/14 Potential to Achieve Goals: Good Progress towards PT goals: Progressing toward goals    Frequency  Min 4X/week    PT Plan Current plan remains appropriate    Co-evaluation             End of Session Equipment Utilized During Treatment: Gait belt Activity Tolerance: Patient tolerated treatment well Patient left: in chair;with call bell/phone within reach;with family/visitor present     Time: 1200-1224 PT Time Calculation (min): 24 min  Charges:  $Gait Training: 23-37 mins  G CodesKenyon Ana 04/20/2014, 12:56 PM

## 2014-04-20 NOTE — Progress Notes (Signed)
Subjective: Left thigh drain placed 7/9 MRI yesterday shows much smaller abscess Rt thigh no significant finding Pt feels good Resting up in chair Eating well  Objective: Vital signs in last 24 hours: Temp:  [97.8 F (36.6 C)-98.2 F (36.8 C)] 97.8 F (36.6 C) (07/14 1330) Pulse Rate:  [73-114] 94 (07/14 1330) Resp:  [14-16] 16 (07/14 1330) BP: (142-174)/(92-97) 142/96 mmHg (07/14 1330) SpO2:  [96 %-97 %] 97 % (07/14 1330) Last BM Date: 04/19/14 (small BM per patient; Did not want medication at this time)  Intake/Output from previous day: 07/13 0701 - 07/14 0700 In: 390 [P.O.:360] Out: 89 [Drains:89] Intake/Output this shift:    PE:  Afeb; vss Wbc wnl 9- cc output yesterday 20 cc in bulb now Bloody/serous fluid +MRSA   Lab Results:   Recent Labs  04/19/14 0545 04/20/14 0445  WBC 7.4 5.6  HGB 15.1 14.0  HCT 45.6 41.7  PLT 389 332   BMET  Recent Labs  04/18/14 0537  NA 137  K 4.0  CL 98  CO2 27  GLUCOSE 108*  BUN 14  CREATININE 0.87  CALCIUM 9.2   PT/INR No results found for this basename: LABPROT, INR,  in the last 72 hours ABG No results found for this basename: PHART, PCO2, PO2, HCO3,  in the last 72 hours  Studies/Results: Mr Foot Right Wo Contrast  04/19/2014   CLINICAL DATA:  Right great toe tenderness.  EXAM: MRI OF THE RIGHT FOREFOOT WITHOUT CONTRAST  TECHNIQUE: Multiplanar, multisequence MR imaging was performed. No intravenous contrast was administered.  COMPARISON:  Plain film 04/14/2014.  FINDINGS: There is no bone marrow signal abnormality suggest osteomyelitis. There is no focal fluid collection. Minimal subcutaneous edema is present over the dorsum of the foot. No notable arthropathy is seen. Intrinsic musculature and all visualized tendons appear normal. There is no mass.  IMPRESSION: Negative for infectious process. No acute finding. Minimal subcutaneous edema over the dorsum of the foot noted.   Electronically Signed   By: Inge Rise M.D.   On: 04/19/2014 17:15   Mr Femur Right W Wo Contrast  04/19/2014   CLINICAL DATA:  Evaluate thigh abscess  EXAM: MR OF THE LEFT LOWER EXTREMITY WITHOUT AND WITH CONTRAST; MRI OF THE RIGHT FEMUR WITHOUT AND WITH CONTRAST  TECHNIQUE: Multiplanar, multisequence MR imaging of the lower left extremity was performed both before and after administration of intravenous contrast.  CONTRAST:  68mL MULTIHANCE GADOBENATE DIMEGLUMINE 529 MG/ML IV SOLN  COMPARISON:  None.  FINDINGS: There is severe muscle edema within the right obturator externus muscle with a peripherally enhancing focal fluid collection measuring 2.4 x 1.2 cm within the obturator externus muscle.  There is muscle edema within the medial aspect of the left adductor longus with enhancement. There is a peripherally enhancing fluid collection superficial to the muscle measuring approximately 2.9 x 1.5 x 7 cm with a crescentic appearance.  There is subcutaneous edema and skin thickening involving the medial aspect of the upper of thighs bilaterally, left greater than right without significant enhancement.  The remainder of the muscles are normal in signal. There is no other fluid collection or hematoma. There is no soft tissue mass. The marrow signal is normal throughout the visualized femurs.  IMPRESSION: 1. Severe enhancing muscle with edema within the right obturator externus muscle with a peripherally enhancing intramuscular 2.4 x 1.2 cm fluid collection most concerning for infectious myositis with a small abscess. 2. Edema within the medial aspect of the left  adductor longus with enhancement and a peripherally enhancing fluid collection superficial to the muscle measuring 2.9 x 1.5 x 7cm cm also concerning for infectious myositis with a small abscess along the medial fascia.   Electronically Signed   By: Kathreen Devoid   On: 04/19/2014 22:03   Mr Femur Left W Wo Contrast  04/19/2014   CLINICAL DATA:  Evaluate thigh abscess  EXAM: MR OF THE  LEFT LOWER EXTREMITY WITHOUT AND WITH CONTRAST; MRI OF THE RIGHT FEMUR WITHOUT AND WITH CONTRAST  TECHNIQUE: Multiplanar, multisequence MR imaging of the lower left extremity was performed both before and after administration of intravenous contrast.  CONTRAST:  28mL MULTIHANCE GADOBENATE DIMEGLUMINE 529 MG/ML IV SOLN  COMPARISON:  None.  FINDINGS: There is severe muscle edema within the right obturator externus muscle with a peripherally enhancing focal fluid collection measuring 2.4 x 1.2 cm within the obturator externus muscle.  There is muscle edema within the medial aspect of the left adductor longus with enhancement. There is a peripherally enhancing fluid collection superficial to the muscle measuring approximately 2.9 x 1.5 x 7 cm with a crescentic appearance.  There is subcutaneous edema and skin thickening involving the medial aspect of the upper of thighs bilaterally, left greater than right without significant enhancement.  The remainder of the muscles are normal in signal. There is no other fluid collection or hematoma. There is no soft tissue mass. The marrow signal is normal throughout the visualized femurs.  IMPRESSION: 1. Severe enhancing muscle with edema within the right obturator externus muscle with a peripherally enhancing intramuscular 2.4 x 1.2 cm fluid collection most concerning for infectious myositis with a small abscess. 2. Edema within the medial aspect of the left adductor longus with enhancement and a peripherally enhancing fluid collection superficial to the muscle measuring 2.9 x 1.5 x 7cm cm also concerning for infectious myositis with a small abscess along the medial fascia.   Electronically Signed   By: Kathreen Devoid   On: 04/19/2014 22:03    Anti-infectives: Anti-infectives   Start     Dose/Rate Route Frequency Ordered Stop   04/19/14 1000  linezolid (ZYVOX) tablet 600 mg     600 mg Oral Every 12 hours 04/19/14 0837     04/16/14 1000  linezolid (ZYVOX) IVPB 600 mg  Status:   Discontinued    Comments:  Pharmacy may adjust time   600 mg 300 mL/hr over 60 Minutes Intravenous Every 12 hours 04/16/14 0818 04/19/14 0837   04/16/14 0800  vancomycin (VANCOCIN) IVPB 1000 mg/200 mL premix  Status:  Discontinued     1,000 mg 200 mL/hr over 60 Minutes Intravenous Every 12 hours 04/15/14 1552 04/16/14 0818   04/15/14 2000  clindamycin (CLEOCIN) IVPB 600 mg  Status:  Discontinued     600 mg 100 mL/hr over 30 Minutes Intravenous Every 6 hours 04/15/14 1833 04/15/14 1933   04/15/14 2000  piperacillin-tazobactam (ZOSYN) IVPB 3.375 g  Status:  Discontinued     3.375 g 12.5 mL/hr over 240 Minutes Intravenous Every 8 hours 04/15/14 1849 04/15/14 1933   04/15/14 2000  linezolid (ZYVOX) tablet 600 mg  Status:  Discontinued     600 mg Oral Every 12 hours 04/15/14 1933 04/16/14 0804   04/15/14 1630  vancomycin (VANCOCIN) 2,250 mg in sodium chloride 0.9 % 500 mL IVPB     2,250 mg 250 mL/hr over 120 Minutes Intravenous  Once 04/15/14 1552 04/15/14 1944      Assessment/Plan: s/p * No surgery  found *  Lt thigh abscess drain intact Doing well Plan for dc home tomorrow per pt Will need to keep drain in for now per Dr Anselm Pancoast reCT or MRI when output less than 10 cc/day   LOS: 6 days    Bhavya Grand A 04/20/2014

## 2014-04-20 NOTE — Progress Notes (Signed)
Patient interviewed and examined, agree with PA note above. Patient is now nearly asymptomatic. I think the small residual fluid collections are likely to resolve with continued antibiotic therapy. Agree with plans for discharge and followup as outlined.  Edward Jolly MD, FACS  04/20/2014 6:52 PM

## 2014-04-20 NOTE — Progress Notes (Signed)
[facility] Progress Note     Subjective: Pt continues to improve.  WBC normal and afebrile.  Continues on abx per ID.  MRI back and shows myositis b/l thighs with fluid collections.  Objective: Vital signs in last 24 hours: Temp:  [98.2 F (36.8 C)] 98.2 F (36.8 C) (07/14 0549) Pulse Rate:  [73-114] 73 (07/14 0549) Resp:  [14-16] 14 (07/14 0549) BP: (159-174)/(92-97) 159/97 mmHg (07/14 0549) SpO2:  [96 %-97 %] 97 % (07/14 0549) Last BM Date: 04/19/14 (small BM per patient; Did not want medication at this time)  Intake/Output from previous day: 07/13 0701 - 07/14 0700 In: 380 [P.O.:360] Out: 68 [Drains:68] Intake/Output this shift:    PE: Gen: Alert, NAD, pleasant  Ext: Left thigh induration/erythema to medial thigh spreads to knee and up to groin crease, drain in place with serosanguinous drainage, slight TTP. Right thigh without erythema, induration or fluctuance.    Lab Results:   Recent Labs  04/19/14 0545 04/20/14 0445  WBC 7.4 5.6  HGB 15.1 14.0  HCT 45.6 41.7  PLT 389 332   BMET  Recent Labs  04/18/14 0537  NA 137  K 4.0  CL 98  CO2 27  GLUCOSE 108*  BUN 14  CREATININE 0.87  CALCIUM 9.2   PT/INR No results found for this basename: LABPROT, INR,  in the last 72 hours CMP     Component Value Date/Time   NA 137 04/18/2014 0537   K 4.0 04/18/2014 0537   CL 98 04/18/2014 0537   CO2 27 04/18/2014 0537   GLUCOSE 108* 04/18/2014 0537   BUN 14 04/18/2014 0537   CREATININE 0.87 04/18/2014 0537   CALCIUM 9.2 04/18/2014 0537   GFRNONAA >90 04/18/2014 0537   GFRAA >90 04/18/2014 0537   Lipase  No results found for this basename: lipase       Studies/Results: Mr Foot Right Wo Contrast  04/19/2014   CLINICAL DATA:  Right great toe tenderness.  EXAM: MRI OF THE RIGHT FOREFOOT WITHOUT CONTRAST  TECHNIQUE: Multiplanar, multisequence MR imaging was performed. No intravenous contrast was administered.  COMPARISON:  Plain film 04/14/2014.   FINDINGS: There is no bone marrow signal abnormality suggest osteomyelitis. There is no focal fluid collection. Minimal subcutaneous edema is present over the dorsum of the foot. No notable arthropathy is seen. Intrinsic musculature and all visualized tendons appear normal. There is no mass.  IMPRESSION: Negative for infectious process. No acute finding. Minimal subcutaneous edema over the dorsum of the foot noted.   Electronically Signed   By: Inge Rise M.D.   On: 04/19/2014 17:15   Mr Femur Right W Wo Contrast  04/19/2014   CLINICAL DATA:  Evaluate thigh abscess  EXAM: MR OF THE LEFT LOWER EXTREMITY WITHOUT AND WITH CONTRAST; MRI OF THE RIGHT FEMUR WITHOUT AND WITH CONTRAST  TECHNIQUE: Multiplanar, multisequence MR imaging of the lower left extremity was performed both before and after administration of intravenous contrast.  CONTRAST:  72mL MULTIHANCE GADOBENATE DIMEGLUMINE 529 MG/ML IV SOLN  COMPARISON:  None.  FINDINGS: There is severe muscle edema within the right obturator externus muscle with a peripherally enhancing focal fluid collection measuring 2.4 x 1.2 cm within the obturator externus muscle.  There is muscle edema within the medial aspect of the left adductor longus with enhancement. There is a peripherally enhancing fluid collection superficial to the muscle measuring approximately 2.9 x 1.5 x 7 cm with a crescentic appearance.  There is subcutaneous edema and skin thickening  involving the medial aspect of the upper of thighs bilaterally, left greater than right without significant enhancement.  The remainder of the muscles are normal in signal. There is no other fluid collection or hematoma. There is no soft tissue mass. The marrow signal is normal throughout the visualized femurs.  IMPRESSION: 1. Severe enhancing muscle with edema within the right obturator externus muscle with a peripherally enhancing intramuscular 2.4 x 1.2 cm fluid collection most concerning for infectious myositis  with a small abscess. 2. Edema within the medial aspect of the left adductor longus with enhancement and a peripherally enhancing fluid collection superficial to the muscle measuring 2.9 x 1.5 x 7cm cm also concerning for infectious myositis with a small abscess along the medial fascia.   Electronically Signed   By: Kathreen Devoid   On: 04/19/2014 22:03   Mr Femur Left W Wo Contrast  04/19/2014   CLINICAL DATA:  Evaluate thigh abscess  EXAM: MR OF THE LEFT LOWER EXTREMITY WITHOUT AND WITH CONTRAST; MRI OF THE RIGHT FEMUR WITHOUT AND WITH CONTRAST  TECHNIQUE: Multiplanar, multisequence MR imaging of the lower left extremity was performed both before and after administration of intravenous contrast.  CONTRAST:  71mL MULTIHANCE GADOBENATE DIMEGLUMINE 529 MG/ML IV SOLN  COMPARISON:  None.  FINDINGS: There is severe muscle edema within the right obturator externus muscle with a peripherally enhancing focal fluid collection measuring 2.4 x 1.2 cm within the obturator externus muscle.  There is muscle edema within the medial aspect of the left adductor longus with enhancement. There is a peripherally enhancing fluid collection superficial to the muscle measuring approximately 2.9 x 1.5 x 7 cm with a crescentic appearance.  There is subcutaneous edema and skin thickening involving the medial aspect of the upper of thighs bilaterally, left greater than right without significant enhancement.  The remainder of the muscles are normal in signal. There is no other fluid collection or hematoma. There is no soft tissue mass. The marrow signal is normal throughout the visualized femurs.  IMPRESSION: 1. Severe enhancing muscle with edema within the right obturator externus muscle with a peripherally enhancing intramuscular 2.4 x 1.2 cm fluid collection most concerning for infectious myositis with a small abscess. 2. Edema within the medial aspect of the left adductor longus with enhancement and a peripherally enhancing fluid  collection superficial to the muscle measuring 2.9 x 1.5 x 7cm cm also concerning for infectious myositis with a small abscess along the medial fascia.   Electronically Signed   By: Kathreen Devoid   On: 04/19/2014 22:03    Anti-infectives: Anti-infectives   Start     Dose/Rate Route Frequency Ordered Stop   04/19/14 1000  linezolid (ZYVOX) tablet 600 mg     600 mg Oral Every 12 hours 04/19/14 0837     04/16/14 1000  linezolid (ZYVOX) IVPB 600 mg  Status:  Discontinued    Comments:  Pharmacy may adjust time   600 mg 300 mL/hr over 60 Minutes Intravenous Every 12 hours 04/16/14 0818 04/19/14 0837   04/16/14 0800  vancomycin (VANCOCIN) IVPB 1000 mg/200 mL premix  Status:  Discontinued     1,000 mg 200 mL/hr over 60 Minutes Intravenous Every 12 hours 04/15/14 1552 04/16/14 0818   04/15/14 2000  clindamycin (CLEOCIN) IVPB 600 mg  Status:  Discontinued     600 mg 100 mL/hr over 30 Minutes Intravenous Every 6 hours 04/15/14 1833 04/15/14 1933   04/15/14 2000  piperacillin-tazobactam (ZOSYN) IVPB 3.375 g  Status:  Discontinued     3.375 g 12.5 mL/hr over 240 Minutes Intravenous Every 8 hours 04/15/14 1849 04/15/14 1933   04/15/14 2000  linezolid (ZYVOX) tablet 600 mg  Status:  Discontinued     600 mg Oral Every 12 hours 04/15/14 1933 04/16/14 0804   04/15/14 1630  vancomycin (VANCOCIN) 2,250 mg in sodium chloride 0.9 % 500 mL IVPB     2,250 mg 250 mL/hr over 120 Minutes Intravenous  Once 04/15/14 1552 04/15/14 1944       Assessment/Plan 1. Left thigh myositis and fluid collection- medial portion of the left adductor longus  -Muscle - perc drain on 7/9  -On Linezolid - abx per ID  -Cultures - MRSA  -WBC count is 5.6, afebrile -Ortho is on board, MRI's showed right and left fluid collections, would leave the decision up to Dr. Gladstone Lighter  given location to muscle tissue, would defer most likely to his expertise.  May benefit from asking IR if they would recommend draining either collection, but  since they are improving and small they may say no.  Antibiotics at discharge per ID.  May need repeat imaging to check for resolution at some point.  Radiologist/ID may have recommendations about timing and drain removal. 2.  Right thigh myositis and fluid collection -As above 3. Infection right great toe  -MRI negative for osteo/abscess 4. DVT prophylaxis - Lovenox  5. On isolation for MRSA  6. Constipation     LOS: 6 days    DORT, Jinny Blossom 04/20/2014, 7:26 AM Pager: [phone]

## 2014-04-20 NOTE — Progress Notes (Signed)
Subjective: Small abscess in right thigh which,clinically is improving on antibiotics.Drainage from Left thigh is very MINIMAL. He is so much better clinically and his WBC is 5.6. Will wait on General Surgery and Infectious Disease to decide on drain and DC.MRI of Great Toe is Negative for Osteo. MRI of both thighs shows minimal fluid collection.Will repeat Sed Rate and CRP.   Objective: Vital signs in last 24 hours: Temp:  [98.2 F (36.8 C)] 98.2 F (36.8 C) (07/14 0549) Pulse Rate:  [73-114] 73 (07/14 0549) Resp:  [14-16] 14 (07/14 0549) BP: (159-174)/(92-97) 159/97 mmHg (07/14 0549) SpO2:  [96 %-97 %] 97 % (07/14 0549)  Intake/Output from previous day: 07/13 0701 - 07/14 0700 In: 380 [P.O.:360] Out: 68 [Drains:68] Intake/Output this shift:     Recent Labs  04/18/14 0537 04/19/14 0545 04/20/14 0445  HGB 14.6 15.1 14.0    Recent Labs  04/19/14 0545 04/20/14 0445  WBC 7.4 5.6  RBC 4.69 4.29  HCT 45.6 41.7  PLT 389 332    Recent Labs  04/18/14 0537  NA 137  K 4.0  CL 98  CO2 27  BUN 14  CREATININE 0.87  GLUCOSE 108*  CALCIUM 9.2   No results found for this basename: LABPT, INR,  in the last 72 hours  No cellulitis present Compartment soft  Assessment/Plan: Ready for DC on Antibiotics.[place] Cell is:[phone].Note will repeat Sed Rate and CRP now.   Lianah Peed A 04/20/2014, 7:14 AM

## 2014-04-21 LAB — CBC WITH DIFFERENTIAL/PLATELET
Basophils Absolute: 0 10*3/uL (ref 0.0–0.1)
Basophils Relative: 1 % (ref 0–1)
Eosinophils Absolute: 0.2 10*3/uL (ref 0.0–0.7)
Eosinophils Relative: 4 % (ref 0–5)
HCT: 40.7 % (ref 39.0–52.0)
Hemoglobin: 13.7 g/dL (ref 13.0–17.0)
Lymphocytes Relative: 24 % (ref 12–46)
Lymphs Abs: 1.3 10*3/uL (ref 0.7–4.0)
MCH: 32.4 pg (ref 26.0–34.0)
MCHC: 33.7 g/dL (ref 30.0–36.0)
MCV: 96.2 fL (ref 78.0–100.0)
Monocytes Absolute: 0.8 10*3/uL (ref 0.1–1.0)
Monocytes Relative: 14 % — ABNORMAL HIGH (ref 3–12)
Neutro Abs: 3.1 10*3/uL (ref 1.7–7.7)
Neutrophils Relative %: 57 % (ref 43–77)
Platelets: 352 10*3/uL (ref 150–400)
RBC: 4.23 MIL/uL (ref 4.22–5.81)
RDW: 13.9 % (ref 11.5–15.5)
WBC: 5.4 10*3/uL (ref 4.0–10.5)

## 2014-04-21 MED ORDER — LINEZOLID 600 MG PO TABS: 600.0000 mg | ORAL_TABLET | Freq: Two times a day (BID) | ORAL | Status: DC

## 2014-04-21 MED ORDER — HYDROMORPHONE HCL 2 MG PO TABS: 2.0000 mg | ORAL_TABLET | Freq: Four times a day (QID) | ORAL | Status: DC | PRN

## 2014-04-21 NOTE — Progress Notes (Signed)
Subjective: Asymptomatic. Drain DCd and thigh looks much better.   Objective: Vital signs in last 24 hours: Temp:  [97.8 F (36.6 C)-98.4 F (36.9 C)] 98.3 F (36.8 C) (07/15 0449) Pulse Rate:  [83-94] 83 (07/15 0449) Resp:  [16-18] 16 (07/15 0449) BP: (136-158)/(83-100) 136/83 mmHg (07/15 0449) SpO2:  [94 %-97 %] 94 % (07/15 0449)  Intake/Output from previous day: 07/14 0701 - 07/15 0700 In: 730 [P.O.:720] Out: 20 [Drains:20] Intake/Output this shift:     Recent Labs  04/19/14 0545 04/20/14 0445 04/21/14 0540  HGB 15.1 14.0 13.7    Recent Labs  04/20/14 0445 04/21/14 0540  WBC 5.6 5.4  RBC 4.29 4.23  HCT 41.7 40.7  PLT 332 352   No results found for this basename: NA, K, CL, CO2, BUN, CREATININE, GLUCOSE, CALCIUM,  in the last 72 hours No results found for this basename: LABPT, INR,  in the last 72 hours  No cellulitis present Compartment soft  Assessment/Plan: DCon ZYVOX 600MG M EVERY 12 HOURS.oFFICE ON THIS fRIDAY.   Gaylia Kassel A 04/21/2014, 7:36 AM

## 2014-04-21 NOTE — Discharge Summary (Signed)
Physician Discharge Summary  Patient ID: Nicholas [name] MRN: [id number] DOB/AGE: 56-Dec-1957 56 y.o.  Admit date: 04/14/2014 Discharge date: 04/21/2014  Admission Diagnoses:Abscess of Left Thigh  Discharge Diagnoses: Abscess of Left Thigh and cellulitis of Right Thigh. Active Problems:   Abscess of right thigh   Abscess of left thigh   Discharged Condition:Improved  Hospital Course: Improved with Drain and IV Zyvox.  Consults: general surgery and Infectious Disease  Significant Diagnostic Studies: microbiology: wound culture: positive for MRSA and radiology: MRI: Both Thighs and RightGreat Toe  Treatments: antibiotics: Zyvox  Discharge Exam: Blood pressure 136/83, pulse 83, temperature 98.3 F (36.8 C), temperature source Oral, resp. rate 16, height 6' (1.829 m), weight 106.595 kg (235 lb), SpO2 94.00%. Extremities: Cellulitis cleared in Left Thigh  Disposition: Final discharge disposition not confirmed     Medication List    ASK your doctor about these medications       aspirin 325 MG tablet  Take 325 mg by mouth daily.     bisacodyl 5 MG EC tablet  Commonly known as:  DULCOLAX  Take 5 mg by mouth daily as needed for moderate constipation.     cyclobenzaprine 10 MG tablet  Commonly known as:  FLEXERIL  Take 10 mg by mouth 3 (three) times daily as needed for muscle spasms.     HYDROmorphone 4 MG tablet  Commonly known as:  DILAUDID  Take 4 mg by mouth every 4 (four) hours as needed for moderate pain or severe pain.     ibuprofen 200 MG tablet  Commonly known as:  ADVIL,MOTRIN  Take 400 mg by mouth 3 (three) times daily as needed for moderate pain.     naproxen 500 MG tablet  Commonly known as:  NAPROSYN  Take 500 mg by mouth 2 (two) times daily with a meal.     predniSONE 5 MG tablet  Commonly known as:  DELTASONE  Take 5 mg by mouth See admin instructions. Day 1: 6 Tabs. Day 2: 6 tabs. Day 3: 5 tabs. Day 4: 5 tabs Day 5: 4 tabs. Day 6: 4 Tabs. Day 7:3 Tabs.  Day 8:3 Tabs. Day 9: 2 tabs. Day 10: 2 tabs. Day 11: 1 Tab. Day 12: 1 tab.         Signed: Anh Mangano A 04/21/2014, 7:41 AM

## 2014-04-21 NOTE — Discharge Instructions (Signed)
Change your dressing daily over the drain site as long as drainage remains. Shower only, no tub bath. Call if any temperatures greater than 101 or any wound complications: [phone] during the day and ask for Dr. Charlestine Night nurse, Brunilda Payor.

## 2014-04-21 NOTE — Progress Notes (Signed)
PT DC instructions reviewed. All questions and concerns were addressed, prescriptions given and reviewed. Pt denies pain at this time, VSS, no apparent distress or discomfort at this time, dsg c/d/i- all other skin intact, supplies given for dsg changes at home. Pt will make his own follow up appt for Friday July 17 at 0900 per pt. Pt waiting on his brother to arrive to transport home. Will continue to monitor until pt left the unit.

## 2014-05-06 ENCOUNTER — Other Ambulatory Visit: Payer: Self-pay | Admitting: Licensed Clinical Social Worker

## 2014-05-06 ENCOUNTER — Telehealth: Payer: Self-pay | Admitting: *Deleted

## 2014-05-06 MED ORDER — DOXYCYCLINE HYCLATE 100 MG PO TABS: 100.0000 mg | ORAL_TABLET | Freq: Two times a day (BID) | ORAL | Status: DC

## 2014-05-06 NOTE — Telephone Encounter (Signed)
Pt called with the following symptoms:  Sleeping poorly, no energy/lethargy and nausea x 22 days since starting oral antibiotics.  Does take phenergan for the nausea but is more concerned with the extreme lethargy and sleep problem.  The pt knows that he has a [facility] appt w/ Dr. Linus Salmons on Mon., Aug 3 @ 1615.  Pt would like to know whether these symptoms are being caused by the antibiotic, Zyvox.  Please advise.

## 2014-05-06 NOTE — Telephone Encounter (Signed)
Would change him to doxy 100mg  bid til he has f/u thanks

## 2014-05-10 ENCOUNTER — Encounter: Payer: Self-pay | Admitting: Internal Medicine

## 2014-05-10 ENCOUNTER — Ambulatory Visit (INDEPENDENT_AMBULATORY_CARE_PROVIDER_SITE_OTHER): Payer: BC Managed Care – PPO | Admitting: Internal Medicine

## 2014-05-10 VITALS — BP 166/112 | HR 89 | Temp 98.1°F | Ht 72.0 in | Wt 225.0 lb

## 2014-05-10 DIAGNOSIS — L03119 Cellulitis of unspecified part of limb: Secondary | ICD-10-CM

## 2014-05-10 DIAGNOSIS — L02416 Cutaneous abscess of left lower limb: Secondary | ICD-10-CM

## 2014-05-10 DIAGNOSIS — Z22322 Carrier or suspected carrier of Methicillin resistant Staphylococcus aureus: Secondary | ICD-10-CM

## 2014-05-10 DIAGNOSIS — M869 Osteomyelitis, unspecified: Secondary | ICD-10-CM | POA: Diagnosis not present

## 2014-05-10 DIAGNOSIS — L02419 Cutaneous abscess of limb, unspecified: Secondary | ICD-10-CM | POA: Diagnosis not present

## 2014-05-10 DIAGNOSIS — L02415 Cutaneous abscess of right lower limb: Secondary | ICD-10-CM

## 2014-05-10 LAB — CBC WITH DIFFERENTIAL/PLATELET
BASOS PCT: 0 % (ref 0–1)
Basophils Absolute: 0 10*3/uL (ref 0.0–0.1)
EOS PCT: 7 % — AB (ref 0–5)
Eosinophils Absolute: 0.5 10*3/uL (ref 0.0–0.7)
HCT: 38.3 % — ABNORMAL LOW (ref 39.0–52.0)
HEMOGLOBIN: 13.3 g/dL (ref 13.0–17.0)
Lymphocytes Relative: 30 % (ref 12–46)
Lymphs Abs: 2.1 10*3/uL (ref 0.7–4.0)
MCH: 31.8 pg (ref 26.0–34.0)
MCHC: 34.7 g/dL (ref 30.0–36.0)
MCV: 91.6 fL (ref 78.0–100.0)
Monocytes Absolute: 1.2 10*3/uL — ABNORMAL HIGH (ref 0.1–1.0)
Monocytes Relative: 17 % — ABNORMAL HIGH (ref 3–12)
Neutro Abs: 3.2 10*3/uL (ref 1.7–7.7)
Neutrophils Relative %: 46 % (ref 43–77)
Platelets: 164 10*3/uL (ref 150–400)
RBC: 4.18 MIL/uL — ABNORMAL LOW (ref 4.22–5.81)
RDW: 14.4 % (ref 11.5–15.5)
WBC: 7 10*3/uL (ref 4.0–10.5)

## 2014-05-10 LAB — COMPREHENSIVE METABOLIC PANEL
ALK PHOS: 112 U/L (ref 39–117)
ALT: 17 U/L (ref 0–53)
AST: 15 U/L (ref 0–37)
Albumin: 3.7 g/dL (ref 3.5–5.2)
BUN: 16 mg/dL (ref 6–23)
CO2: 28 meq/L (ref 19–32)
Calcium: 8.9 mg/dL (ref 8.4–10.5)
Chloride: 105 mEq/L (ref 96–112)
Creat: 1.02 mg/dL (ref 0.50–1.35)
Glucose, Bld: 94 mg/dL (ref 70–99)
Potassium: 4.1 mEq/L (ref 3.5–5.3)
Sodium: 142 mEq/L (ref 135–145)
Total Bilirubin: 0.3 mg/dL (ref 0.2–1.2)
Total Protein: 6.4 g/dL (ref 6.0–8.3)

## 2014-05-10 MED ORDER — CHLORHEXIDINE GLUCONATE 4 % EX LIQD: Freq: Every day | CUTANEOUS | Status: DC

## 2014-05-10 MED ORDER — MUPIROCIN 2 % EX OINT: 1.0000 "application " | TOPICAL_OINTMENT | Freq: Two times a day (BID) | CUTANEOUS | Status: DC

## 2014-05-10 MED ORDER — MUPIROCIN 2 % EX OINT
1.0000 | TOPICAL_OINTMENT | Freq: Two times a day (BID) | CUTANEOUS | Status: DC
Start: 2014-05-10 — End: 2014-05-10

## 2014-05-10 NOTE — Assessment & Plan Note (Signed)
Myositis with abscess.  Took about 3 weeks of anti biotics which may be enough after drainage.  I will recheck and MRI of both to verify and if ok can stay off of antibiotics since he is intolerant and does not want to continue.  I also will check ESR and CBC to see if any concerns.

## 2014-05-10 NOTE — Assessment & Plan Note (Signed)
Gave instructions for decolonization.

## 2014-05-10 NOTE — Progress Notes (Signed)
   Subjective:    Patient ID: Nicholas [name], male    DOB: 04/11/1956, 58 y.o.   MRN: [id number]  HPI Nicholas [name] is a 58 y.o. male with no significant past medical history who bikes actively and presented with pain in left left, back and radiation to right leg. Work up revealed abscess in left leg noted on CT. No inciting event of trauma, bite. Drained by IR and placed on zyvox.  MRI done and was significant for myositis of both legs.  Continued on zyvox but changed to doxycycline last week due to fatigue and general mylaise.  Took doxy through last Saturday but stopped due to side effects.  No significant pain in legs, no fever, no chills.  Grew MRSA and is doxy, SMX sensitive.      Review of Systems  Constitutional: Positive for fatigue. Negative for fever, chills and unexpected weight change.  Gastrointestinal: Negative for diarrhea.  Musculoskeletal: Negative for joint swelling and myalgias.  Neurological: Negative for dizziness and light-headedness.  Hematological: Negative for adenopathy.       Objective:   Physical Exam  Constitutional: He appears well-developed and well-nourished. No distress.  Eyes: No scleral icterus.  Cardiovascular: Normal rate, regular rhythm and normal heart sounds.   No murmur heard. Pulmonary/Chest: Effort normal and breath sounds normal. No respiratory distress.  Skin: No rash noted.  Closed incision from drain          Assessment & Plan:

## 2014-05-10 NOTE — Patient Instructions (Signed)
[facility] Five day Treatment Plan for MRSA (Staph) Decolonization  Please let us know if you have questions or concerns or do not understand the information we give you.  Prepare Chose a period when you will be uninterrupted by going away or other distractions. To ensure that your skin is in good condition, follow the Routine Skin Care principles below to reduce drying and enhance healing. Do not start while you have any active boils. Routine Skin Care principles to reduce drying and enhance healing:  Avoid the use of soap when bathing or showering. DO NOT routinely use antiseptic solutions. If you need to use something, chose a soap substitute (examples -QV Wash or Cetaphil).   When drying with a towel, be gentle and pat dry your skin. Avoid rubbing the skin.   Reduce the overall frequency of bathing or showering. A short shower (3 minutes) is better than a bath in terms of its effect on the skin.   Use a simple sorbelene based-cream on your skin prior to showering and immediately after drying. (Examples: Hydroderm or other Sorbelene-based preparations). Especially protect healing or dry areas of skin in this way. Don't use a barrier cream with a vaseline base or with perfumes and additives. You are more likely to be allergic to these products, and cause further damage to your skin.   Make sure that you clean and cover any skin cuts or grazes that occur. Try to avoid picking or biting fingernails and the skin around the nails Keep your fingernails clipped short and clean to reduce problems caused by scratching.   For itchy skin, try gently massaging sorbelene-based cream into itchy areas instead of scratching it. A long-acting non-sedating antihistamine drug is the next option to try. However make sure that you are not taking medication that will interact with this drug type.                                                                         To prepare yourself  for your treatment, it is recommended that you complete the following steps:  Remove nose, ear and other body piercing items for several days prior to the treatment and keep them out during the treatment period   Purchase a new toothbrush, disposable razor (if used), sterident for dentures (if required) and a container of alcohol hand hygiene solution (gel or rub)   Discard old toothbrushes and razors when the treatment starts. Also discard opened deodorant rollers, skin adhesive tapes, skin creams and solutions- all of these may already be contaminated with staph   Discard pumice stone(s), sponges and disposable face cloths if used    Discard all make-up brushes, creams, and implements   Discard or hot wash all fluffy toys   Wash hair brush and comb, nail files, plastic toys and cutters in the dishwasher or purchase new ones   Remove nail varnish and artificial nails  Daily routine for 5 days     *Minimize contact with members of the community during the 5 days of treatment* Body washes The effectiveness of the program increases if the correct procedure is used:  Apply the provided antiseptic body wash (2% chlorhexidine) in the shower daily   Take   care to wash hair, under the arms and into the groin and into any folds of skin   Allow the antiseptic to remain on the skin for at least 5 minutes  Nasal ointment  Disinfect your hands with alcohol gel/rub and allow to dry    Open the mupirocin 2% (Bactroban) nasal ointment.   Place small amount (size of match head) of ointment onto a clean cotton swab and massage gently around the inside of the nostril on one side, making sure not to insert it too deeply (no more than 2 cm or a little less than an inch).   Use a new cotton bud for the other nostril so that you do not contaminate the Bactroban tube with staph.    After applying the ointment, press a finger against the nose next to the nostril opening and use a circular motion to  spread the ointment within the nose   Apply the mupirocin ointment two times a day for 5 days   Disinfect your hands with alcohol rub/gel after applying the ointmentp>  Personal items (combs, razor, eyeglasses, jewelry, etc.)     Disinfect all personal items daily with an alcohol-based cleanser  House Environment and Clothes/Linens  On day 2 and 5 of the treatment, clean your house, (especially the bedroom and bathroom). Clean dust off all surfaces and then vacuum clean all floor surfaces AND soft furnishings (such as your favorite chair). If your chair/couch has a vinyl or leather covering then wipe over the chair with warm soapy water and then dry with a clean towel (which should then be washed). Staph lives in skin scales from humans that contaminate the environment. This can lead to re-infection.    Disinfect the shower floor and/or bath tub daily    On days 1, 3, AND 5 of the treatment wash your clothes, underwear, pajamas and bed linen (such as towels, sheets, washcloths, and bath mats). A hot wash with laundry detergent is best (there is no need to use expensive laundry detergent or powder). Dry clothes in sun if possible. Change into clean clothes or pajamas on those days after your shower.    Do not share or exchange any personal items of clothing  Sports/Gym     Surfaces, equipment and towels, and skin-to-skin contact are all potential sources for staph re-infection Pets Dogs and other companion animals can also be colonized with the same strains of MRSA. Best to wash or replace bedding material for the animal and wash the animal at least once during the treatment period with antiseptic solution (2% chlorhexidine wash). Ensure that the skin of the animal is kept in good condition. If the pet has any chronic skin disorders, consult your vet prior to starting your treatment process. What about my partner, family, or household members? Usually when an aggressive strain of staph moves in  to a family or household, only certain members of that group get infections (boils). This is despite the fact that the strain has probably transferred itself from person to person within the group. Those without boils may also be carrying the bacterium, however they must have better resistance (immunity) or perhaps have better skin condition. Staph likes to invade through cuts, scratches and skin with dermatitis or dryness.    Follow-up after decolonization treatment  Possible approaches will vary depending on how your treatment goes. Your provider will instruct you on the follow-up best suited for you. Some options are:   Wait and see - if no further   boils occur within 6 months then it is probable that the strain of staph has been eliminated from you.    Continue intermittent body washes 1-2 times per week with 2% aqueous chlorhexidine soap preparation or similar   Future antibiotic use  The best preventative approach to avoiding future problems may be to avoid use of antibiotics unless there is a strong indication. Antibiotics are often prescribed for minor infections or for respiratory infections that are mostly due to viruses. It is in your best interest to ride these infections out rather than taking antibiotics. Taking antibiotics alters your natural bacterial flora on your skin and in your gut. This may reduce your resistance against acquiring a resistant bacterial strain such as MRSA).   Important note: if you do become very ill with possible infection and require hospital review, it is important for you to remind your medical care providers that you have been colonized with MRSA in the past as they may have to use antibiotics that are active against the strain that you had previously.   References Wiese-Posselt et al. Clin Inf Dis 2007:44:e88  CDC:  http://www.cdc.gov/ncidod/dhqp/ar_mrsa_ca.html 

## 2014-05-11 LAB — SEDIMENTATION RATE: Sed Rate: 7 mm/hr (ref 0–16)

## 2014-05-12 ENCOUNTER — Telehealth: Payer: Self-pay | Admitting: *Deleted

## 2014-05-12 NOTE — Telephone Encounter (Signed)
Patient called the office to check and see if his MRI had been scheduled. Advised the patient we have to get authorization from his insurance company and once that happens we will schedule it and give him a call.

## 2014-05-13 ENCOUNTER — Telehealth: Payer: Self-pay | Admitting: *Deleted

## 2014-05-13 NOTE — Telephone Encounter (Signed)
Called patient and notified him of appt for MRI at [facility] in [facility] for 05/19/14 at 10:30 AM. He said he may need to change this appt and # provided [phone]. Orders, demographics, past imaging results, and insurance information faxed to [phone]. Requested that results be faxed to this clinic.

## 2014-05-18 DIAGNOSIS — M869 Osteomyelitis, unspecified: Secondary | ICD-10-CM | POA: Insufficient documentation

## 2014-05-18 NOTE — Addendum Note (Signed)
Addended by: Thayer Headings on: 05/18/2014 04:18 PM   Modules accepted: Orders

## 2014-05-18 NOTE — Assessment & Plan Note (Addendum)
Addendum 8/11.  MRI report sent to me from [place].  It shows osteomyelitis of pubic symphisis, still some abscess though better overall.  Osteomyelitis is new finding. Was MRSA positive on culture so will start vancomycin and get picc line.  Interesting that the ESR was wnl.  rtc 2 weeks.  Discussed with patient findings and plan and he is agreeable.

## 2014-05-19 ENCOUNTER — Other Ambulatory Visit: Payer: Self-pay | Admitting: Internal Medicine

## 2014-05-19 ENCOUNTER — Other Ambulatory Visit (HOSPITAL_COMMUNITY): Payer: Self-pay | Admitting: *Deleted

## 2014-05-19 ENCOUNTER — Telehealth: Payer: Self-pay | Admitting: *Deleted

## 2014-05-19 ENCOUNTER — Ambulatory Visit (HOSPITAL_COMMUNITY)
Admission: RE | Admit: 2014-05-19 | Discharge: 2014-05-19 | Disposition: A | Discharge status: Home / Self Care | Payer: BC Managed Care – PPO | Source: Ambulatory Visit | Attending: Internal Medicine | Admitting: Internal Medicine

## 2014-05-19 DIAGNOSIS — L02416 Cutaneous abscess of left lower limb: Secondary | ICD-10-CM

## 2014-05-19 DIAGNOSIS — Z452 Encounter for adjustment and management of vascular access device: Secondary | ICD-10-CM | POA: Diagnosis present

## 2014-05-19 DIAGNOSIS — M869 Osteomyelitis, unspecified: Secondary | ICD-10-CM | POA: Insufficient documentation

## 2014-05-19 MED ORDER — LIDOCAINE HCL 1 % IJ SOLN
INTRAMUSCULAR | Status: DC
Start: 2014-05-19 — End: 2014-05-20
  Filled 2014-05-19: qty 20

## 2014-05-19 MED ORDER — HEPARIN SOD (PORK) LOCK FLUSH 100 UNIT/ML IV SOLN
500.0000 [IU] | Freq: Once | INTRAVENOUS | Status: AC
  Administered 2014-05-19: 500 [IU] via INTRAVENOUS

## 2014-05-19 MED ORDER — VANCOMYCIN HCL IN DEXTROSE 1-5 GM/200ML-% IV SOLN
1000.0000 mg | Freq: Once | INTRAVENOUS | Status: AC
  Administered 2014-05-19: 1000 mg via INTRAVENOUS

## 2014-05-19 MED ORDER — HEPARIN SODIUM (PORCINE) 5000 UNIT/ML IJ SOLN
250.0000 [IU] | Freq: Once | INTRAMUSCULAR | Status: DC
  Filled 2014-05-19: qty 0.05

## 2014-05-19 NOTE — Telephone Encounter (Signed)
Doxycycline side effect - constipation.  Has been taking stool softener and increased fiber in his diet.  Pt is wondering about taking a "probiotic."   RN spoke with Dr. Linus Salmons who recommended Florastor OTC.    Spoke with the pt and gave him the recommendation from Dr. Linus Salmons.  Pt stated that he would go to [facility] on [address] and talk with the pharmacist.

## 2014-05-19 NOTE — Telephone Encounter (Signed)
Called patient and informed him of appointment for picc line placement at [facility] for today at 1:00 pm. Faxed order to short stay for first dose of vancomycin. Also faxed order to [facility] for IV antibiotic management. Myrtis Hopping

## 2014-05-19 NOTE — Progress Notes (Signed)
Patient ID: Nicholas [name], male   DOB: 07-28-1956, 58 y.o.   MRN: [id number]   Osteomyelitis - leg  PICC requested for IV Vanco  SL PICC placed Rt arm Basilic Vein 39 cm Tip in SVC/RA junction  Ready to use  Pt tolerated well

## 2014-05-21 NOTE — Progress Notes (Signed)
Agree 

## 2014-05-24 ENCOUNTER — Ambulatory Visit: Payer: BC Managed Care – PPO | Admitting: Infectious Diseases

## 2014-06-10 ENCOUNTER — Ambulatory Visit (INDEPENDENT_AMBULATORY_CARE_PROVIDER_SITE_OTHER): Payer: BC Managed Care – PPO | Admitting: Internal Medicine

## 2014-06-10 ENCOUNTER — Encounter: Payer: Self-pay | Admitting: Internal Medicine

## 2014-06-10 VITALS — BP 159/104 | HR 78 | Temp 98.2°F | Wt 234.0 lb

## 2014-06-10 DIAGNOSIS — Z23 Encounter for immunization: Secondary | ICD-10-CM | POA: Diagnosis not present

## 2014-06-10 DIAGNOSIS — M869 Osteomyelitis, unspecified: Secondary | ICD-10-CM

## 2014-06-10 NOTE — Assessment & Plan Note (Signed)
Doing well on vancomycin and about 2 weeks into a 6 week course.  I will consider continuing with Bactrim after that and recheck an MRI to be sure abscess has resolved.

## 2014-06-10 NOTE — Progress Notes (Signed)
Subjective:    Patient ID: Nicholas [name], male    DOB: 1956/05/01, 58 y.o.   MRN: [id number]  HPI [name] is a 58 y.o. male with no significant past medical history who bikes actively and presented with pain in left left, back and radiation to right leg. Work up revealed abscess in left leg noted on CT. No inciting event of trauma, bite. Drained by IR and placed on zyvox.  MRI done and was significant for myositis of both legs.  Continued on zyvox but changed to doxycycline but stopped due to side effects.    After this, I rechecked an MRI and noted he now had significant osteomyelitis of pubic symphisis.  Had MRSA culture positive from OR and so was started on IV vancomycin after discussing results with him.  Here for follow up and has been on it 2 weeks.    ESR was 7, which was down from 54 during the hospitalization.      Review of Systems  Constitutional: Negative for fever, chills and unexpected weight change.  Gastrointestinal: Negative for diarrhea.  Musculoskeletal: Negative for joint swelling and myalgias.  Neurological: Negative for dizziness and light-headedness.  Hematological: Negative for adenopathy.       Objective:   Physical Exam  Constitutional: He appears well-developed and well-nourished. No distress.  Eyes: No scleral icterus.  Cardiovascular: Normal rate, regular rhythm and normal heart sounds.   No murmur heard. Pulmonary/Chest: Effort normal and breath sounds normal. No respiratory distress.  Skin: No rash noted.  Closed incision from drain          Assessment & Plan:

## 2014-06-17 ENCOUNTER — Telehealth: Payer: Self-pay | Admitting: *Deleted

## 2014-06-17 NOTE — Telephone Encounter (Signed)
Have it extended until the visit.  I am going to schedule the MRI at the next visit even though it may take a week or two after that to get scheduled.  He will still be on pills antibiotics.

## 2014-06-17 NOTE — Telephone Encounter (Signed)
Verbal order per Dr. Linus Salmons given to [facility] to extend IV antibiotics through 07/05/14. Patient notified.

## 2014-06-17 NOTE — Telephone Encounter (Signed)
Patient called stating the end date for his IV antibiotic is 06/30/14 and follow up appt is 07/05/14. No sooner appt available. Also wanted to know when the next MRI needed to be scheduled. Please advise if antibiotic needs to be extended or picc line pulled prior to visit.  Myrtis Hopping

## 2014-06-22 ENCOUNTER — Telehealth: Payer: Self-pay

## 2014-06-22 NOTE — Telephone Encounter (Signed)
Nicholas [name] at [facility] notified. Nicholas [name]

## 2014-06-22 NOTE — Telephone Encounter (Signed)
Patient states his PICC has come out about 6 cm. It is completely covered, receiving antibiotics well but will not return blood. He wants to make sure it is ok to leave as is or should it be removed.   Per Dr Linus Salmons patient is nearing end of IV antibiotics and if the medication is flowing fine and patient is without any symptoms it is ok to leave in place.  Patient was advised if he develops sharp shooting pains,  numbness in limb or any abnormal symptoms to call our office for order for removal.    Laverle Patter, RN

## 2014-07-05 ENCOUNTER — Encounter: Payer: Self-pay | Admitting: Internal Medicine

## 2014-07-05 ENCOUNTER — Ambulatory Visit (INDEPENDENT_AMBULATORY_CARE_PROVIDER_SITE_OTHER): Payer: BC Managed Care – PPO | Admitting: Internal Medicine

## 2014-07-05 VITALS — BP 129/85 | HR 79 | Temp 98.1°F | Wt 238.0 lb

## 2014-07-05 DIAGNOSIS — M869 Osteomyelitis, unspecified: Secondary | ICD-10-CM

## 2014-07-05 MED ORDER — RIFAMPIN 300 MG PO CAPS: 300.0000 mg | ORAL_CAPSULE | Freq: Two times a day (BID) | ORAL | Status: DC

## 2014-07-05 MED ORDER — SULFAMETHOXAZOLE-TMP DS 800-160 MG PO TABS: 2.0000 | ORAL_TABLET | Freq: Two times a day (BID) | ORAL | Status: DC

## 2014-07-05 NOTE — Progress Notes (Signed)
Patient ID: Nicholas [name], male   DOB: Mar 11, 1956, 58 y.o.   MRN: [id number]   Subjective:    Patient ID: Nicholas [name], male    DOB: April 21, 1956, 58 y.o.   MRN: [id number]  HPI Nicholas [name] is a 58 y.o. male with no significant past medical history who bikes actively and presented with pain in left left, back and radiation to right leg. Work up revealed abscess in left leg noted on CT. No inciting event of trauma, bite. Drained by IR and placed on zyvox.  MRI done and was significant for myositis of both legs.  Continued on zyvox but changed to doxycycline but stopped due to side effects.    After this, I rechecked an MRI and noted he now had significant osteomyelitis of pubic symphisis.  Had MRSA culture positive from OR and so was started on IV vancomycin after discussing results with him.  Here for follow up and has been on it 2 weeks.    ESR was 7, which was down from 54 during the hospitalization.      Review of Systems  Constitutional: Negative for fever, chills and unexpected weight change.  Gastrointestinal: Negative for diarrhea.  Musculoskeletal: Negative for joint swelling and myalgias.  Neurological: Negative for dizziness and light-headedness.  Hematological: Negative for adenopathy.       Objective:   Physical Exam  Constitutional: He appears well-developed and well-nourished. No distress.  Eyes: No scleral icterus.  Cardiovascular: Normal rate, regular rhythm and normal heart sounds.   No murmur heard. Pulmonary/Chest: Effort normal and breath sounds normal. No respiratory distress.  Skin: No rash noted.          Assessment & Plan:

## 2014-07-05 NOTE — Assessment & Plan Note (Signed)
Doing good.  Will pull picc and continue bactrim with rifampin.  Patient counseled on side effects.  Will repeat MRI to assure resolution. Consider 2-3 months of treatment orally unless concerns.

## 2014-07-14 ENCOUNTER — Telehealth: Payer: Self-pay | Admitting: *Deleted

## 2014-07-14 NOTE — Telephone Encounter (Signed)
Called patient and left voice mail to return my call. MRI appointment at [place] [facility] for 07/21/14 at 2:45 pm. Order, Demographics and Insurance faxed to [phone]. If patient needs to reschedule he can call [phone].

## 2014-07-14 NOTE — Telephone Encounter (Signed)
Patient notified

## 2014-07-23 ENCOUNTER — Telehealth: Payer: Self-pay | Admitting: *Deleted

## 2014-07-23 NOTE — Telephone Encounter (Signed)
Patient called for MRI results from [facility]. Notified patient that we received results yesterday at 5:00 pm and it is in Dr. Henreitta Leber box for review.  Dr. Linus Salmons will be back on Monday afternoon at 1:30 pm.

## 2014-07-26 NOTE — Telephone Encounter (Signed)
Looks good, much improved.  Abscess is all gone.  He should continue with the bactrim and rifampin and I would plan on 3 months and reassess.  He probably has a follow up already in a month or so?  thanks

## 2014-07-27 NOTE — Telephone Encounter (Signed)
This information was given to patient by Harvin Hazel, RN. Myrtis Hopping CMA

## 2014-08-03 ENCOUNTER — Ambulatory Visit: Payer: BC Managed Care – PPO | Admitting: Internal Medicine

## 2014-08-12 ENCOUNTER — Encounter: Payer: Self-pay | Admitting: Internal Medicine

## 2014-08-31 ENCOUNTER — Ambulatory Visit (INDEPENDENT_AMBULATORY_CARE_PROVIDER_SITE_OTHER): Payer: BC Managed Care – PPO | Admitting: Internal Medicine

## 2014-08-31 ENCOUNTER — Encounter: Payer: Self-pay | Admitting: Internal Medicine

## 2014-08-31 VITALS — BP 127/86 | HR 60 | Temp 98.8°F | Wt 240.0 lb

## 2014-08-31 DIAGNOSIS — M869 Osteomyelitis, unspecified: Secondary | ICD-10-CM | POA: Diagnosis not present

## 2014-08-31 NOTE — Assessment & Plan Note (Signed)
Completed treatment and doing well. Discussed concerning signs of return.  RTC PRN.

## 2014-08-31 NOTE — Progress Notes (Signed)
Patient ID: Nicholas [name], male   DOB: [birth year], 58 y.o.   MRN: [id number]   Subjective:    Patient ID: Nicholas [name], male    DOB: 03/13/1956, 58 y.o.   MRN: [id number]  HPI Nicholas [name] is a 58 y.o. male with no significant past medical history who bikes actively and presented with pain in left left, back and radiation to right leg. Work up revealed abscess in left leg noted on CT. No inciting event of trauma, bite. Drained by IR and placed on zyvox.  MRI done and was significant for myositis of both legs.  Continued on zyvox but changed to doxycycline but stopped due to side effects.    After this, I rechecked an MRI and noted he now had significant osteomyelitis of pubic symphisis.  Had MRSA culture positive from OR and so was started on IV vancomycin after discussing results with him.  Here for follow up and has been on it 2 weeks.    ESR was 7, which was down from 54 during the hospitalization.   Now has completed nearly 2 months of oral continuation and feels great.  No pain, full movement and ROM, no fever, no chills.  Inflammatory markers had already normalized.      Review of Systems  Constitutional: Negative for fever, chills and unexpected weight change.  Gastrointestinal: Negative for diarrhea.  Musculoskeletal: Negative for myalgias and joint swelling.  Neurological: Negative for dizziness and light-headedness.  Hematological: Negative for adenopathy.       Objective:   Physical Exam  Constitutional: He appears well-developed and well-nourished.  Musculoskeletal: Normal range of motion.  Skin: No rash noted.          Assessment & Plan:

## 2014-09-24 ENCOUNTER — Encounter: Payer: Self-pay | Admitting: Internal Medicine

## 2014-09-28 ENCOUNTER — Encounter: Payer: Self-pay | Admitting: Internal Medicine

## 2016-05-08 IMAGING — CT CT PELVIS W/ CM
2 of 3 series · 17 of 46 positions shown, 19 images · IV contrast (READICAT/WATER & [ID] OMNI 300)
Comparison: None.

ADDENDUM:
Critical Value/emergent results were called by telephone at the time
of interpretation on 04/14/2014 at [DATE] to Dr. SADIA MARTINE , who
verbally acknowledged these results.
CLINICAL DATA: Thigh pain.

EXAM:
CT PELVIS WITH CONTRAST
TECHNIQUE: Multidetector CT imaging of the pelvis was performed using the
standard protocol following the bolus administration of intravenous
contrast.
CONTRAST:  125mL OMNIPAQUE IOHEXOL 300 MG/ML  SOLN

[Series 2: routine pelvis · axial · 0.76mm/px · z∈[-409,-34]mm · 14 of 61 slices shown, 16 images]
[im 4/61  soft-tissue]
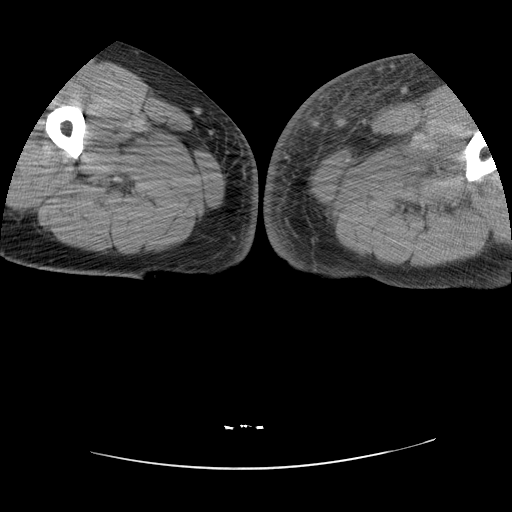
[im 4/61  bone]
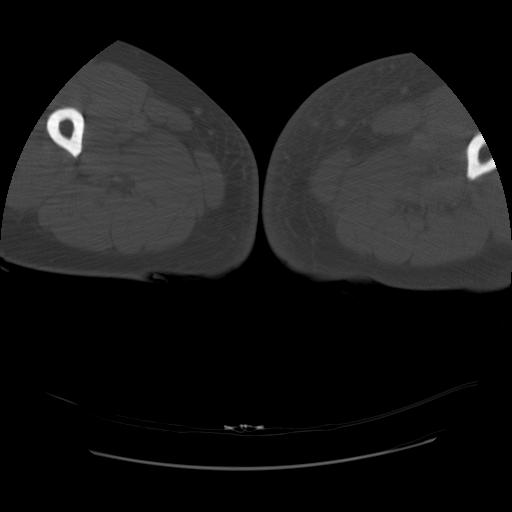
[im 8/61  soft-tissue]
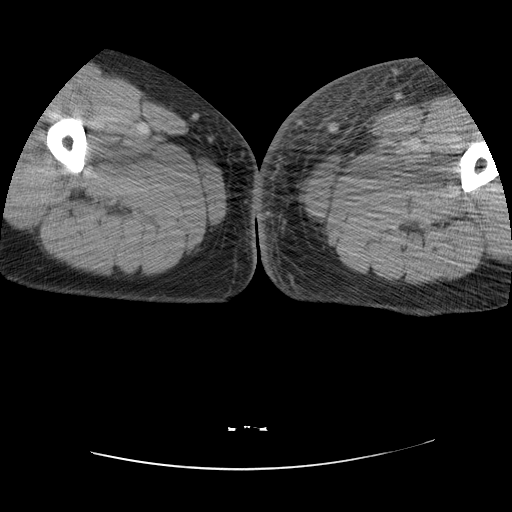
[im 12/61  soft-tissue]
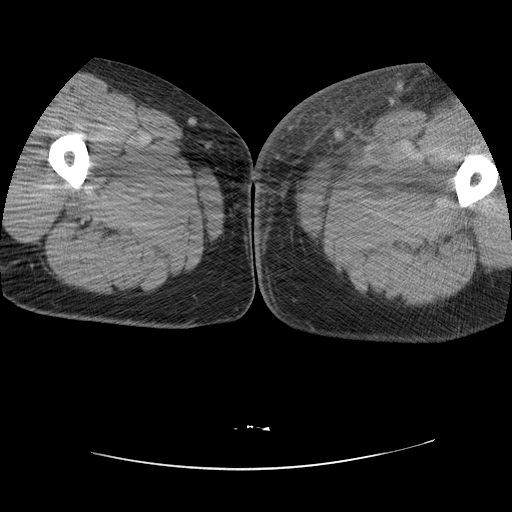
[im 16/61  soft-tissue]
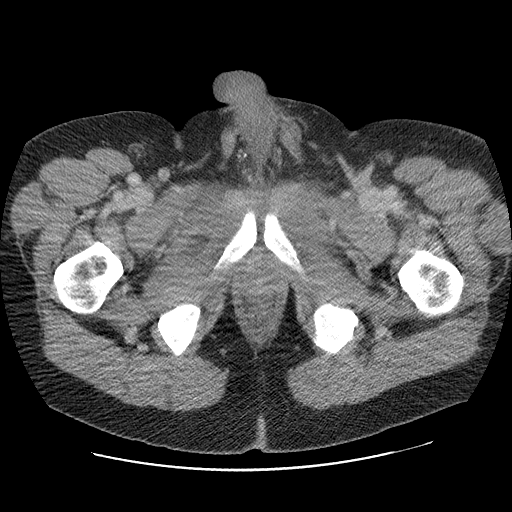
[im 20/61  soft-tissue]
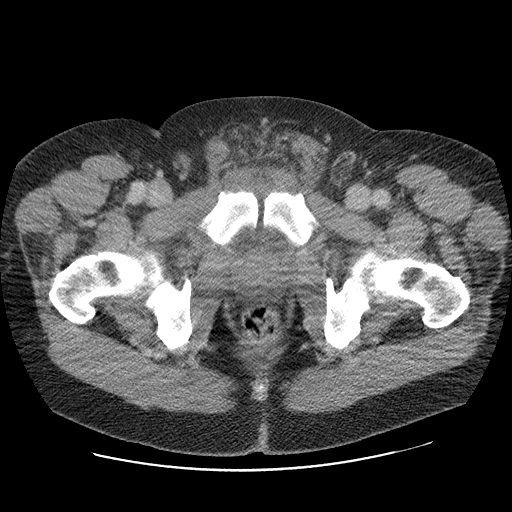
[im 24/61  soft-tissue]
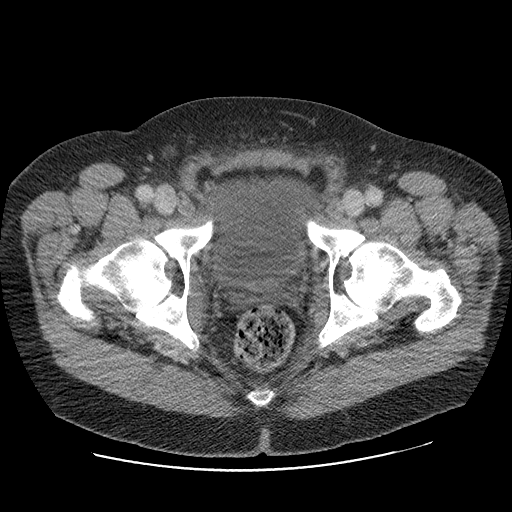
[im 28/61  soft-tissue]
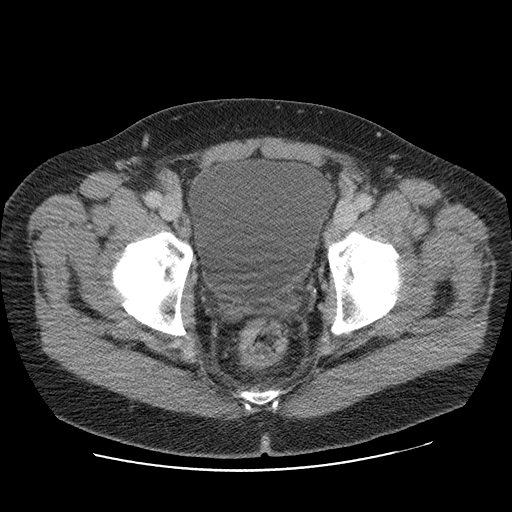
[im 33/61  soft-tissue]
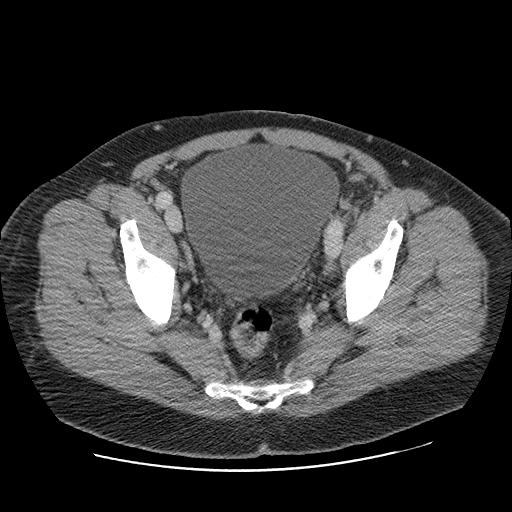
[im 37/61  soft-tissue]
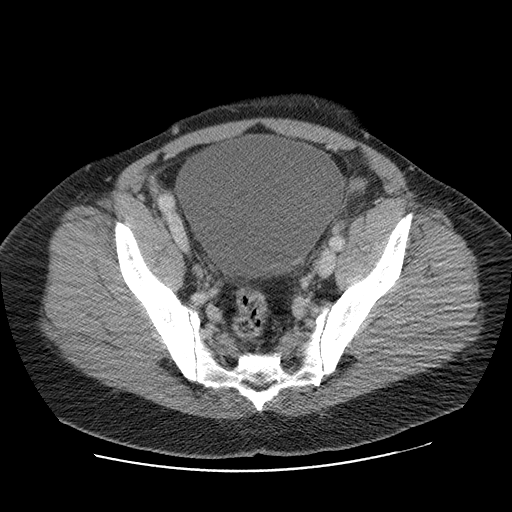
[im 37/61  bone]
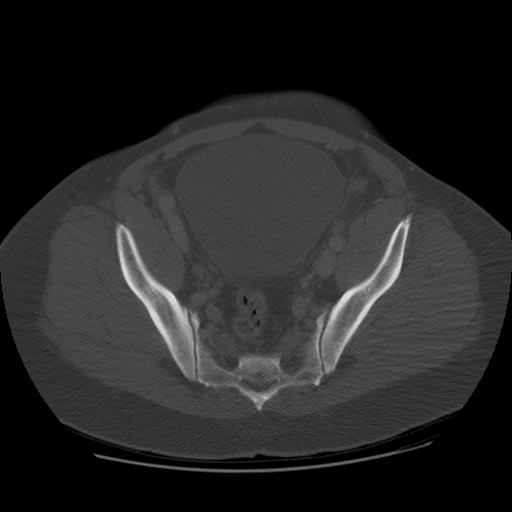
[im 41/61  soft-tissue]
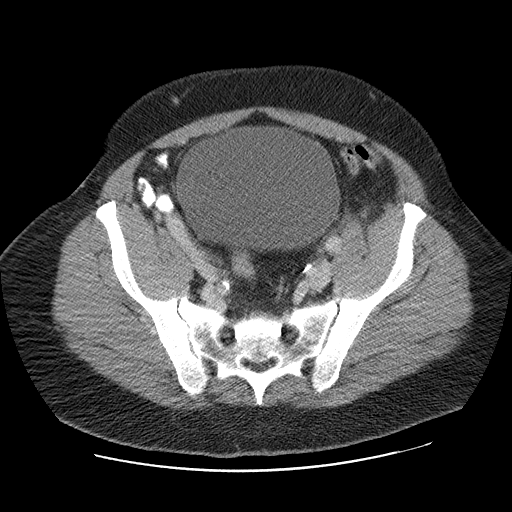
[im 45/61  soft-tissue]
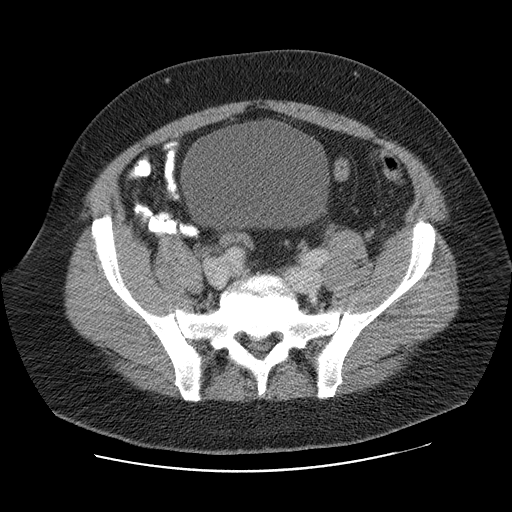
[im 49/61  soft-tissue]
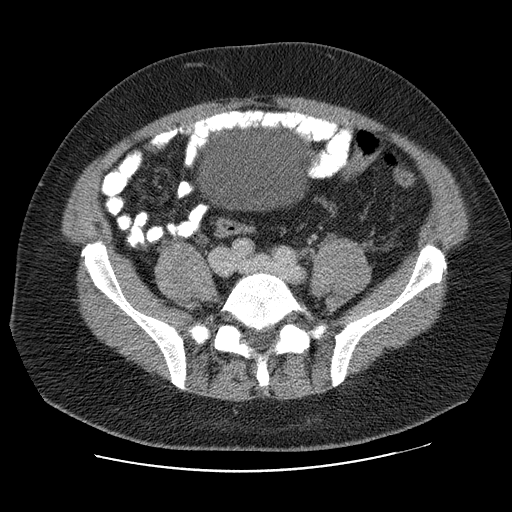
[im 53/61  soft-tissue]
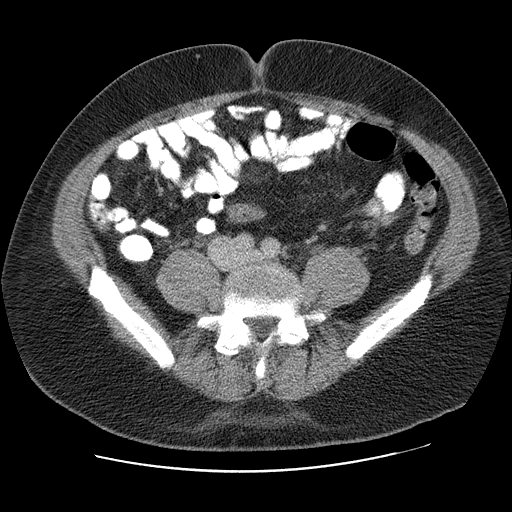
[im 57/61  soft-tissue]
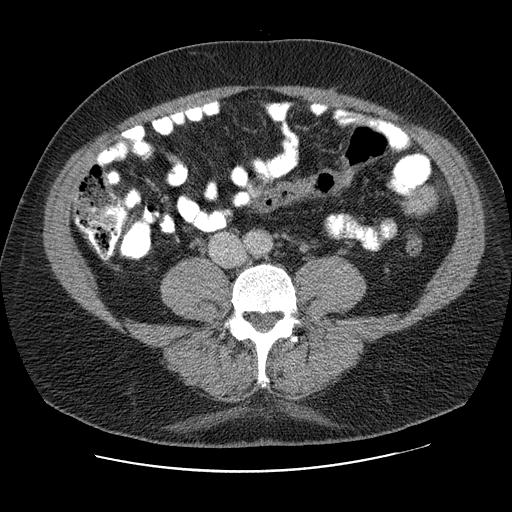

[Series 300: cor · coronal · 0.82mm/px · 3 of 145 slices shown]
[im 49/145  soft-tissue]
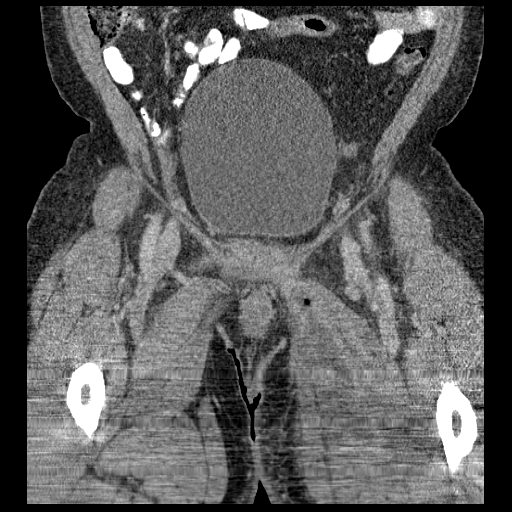
[im 65/145  soft-tissue]
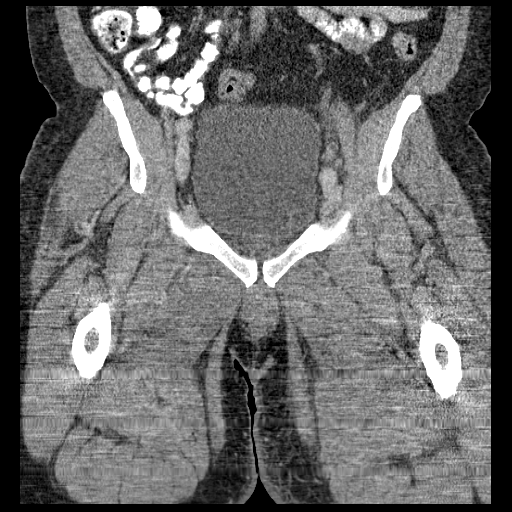
[im 81/145  soft-tissue]
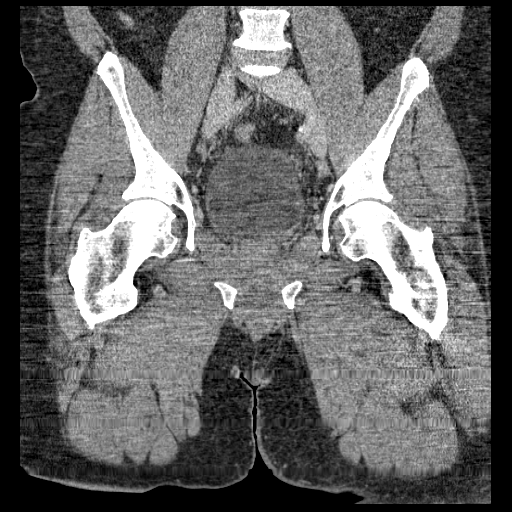

[17 of 46 positions shown; findings below may reference images not displayed]

FINDINGS: No significant osseous abnormality is noted. Urinary bladder appears
normal. No intraperitoneal fluid collection is noted. Visualized
portion of the appendix appears normal. 12.9 x 5.2 x 3.9 cm
peripherally enhancing fluid collection is noted beginning medially
and along the course of the left adductor longus muscle. Small
amount of gas is noted in the superior portion of this abnormality.
Stranding of the overlying subcutaneous tissues is noted concerning
for possible cellulitis or edema. Mildly enlarged lymph nodes are
noted in the left external iliac region, all of which are less than
1 cm in minor axis. These most likely are inflammatory in origin.
IMPRESSION: Peripherally enhancing fluid collection measuring 12.9 x 5.2 x
cm is noted along the medial portion of the left adductor longus
muscle in the proximal left thigh and inguinal region. The contain
soft tissue gas and has stranding of the overlying subcutaneous
tissues and is concerning for probable abscess. However, hematoma
cannot be excluded but felt much less likely. Probable mild
inflammatory adenopathy seen in left external iliac region. I
contacted Dr. [REDACTED], but he is currently in surgery and
they will contact him. These results will be called to the ordering
clinician or representative by the Radiologist Assistant, and
communication documented in the PACS or zVision Dashboard.

## 2017-02-28 ENCOUNTER — Encounter (HOSPITAL_COMMUNITY): Payer: Self-pay

## 2017-02-28 ENCOUNTER — Emergency Department (HOSPITAL_COMMUNITY): Payer: PRIVATE HEALTH INSURANCE

## 2017-02-28 ENCOUNTER — Inpatient Hospital Stay (HOSPITAL_COMMUNITY)
Admission: EM | Admit: 2017-02-28 | Discharge: 2017-03-02 | DRG: 392 | Disposition: A | Discharge status: Home / Self Care | Payer: PRIVATE HEALTH INSURANCE | Attending: Internal Medicine | Admitting: Internal Medicine

## 2017-02-28 DIAGNOSIS — K2210 Ulcer of esophagus without bleeding: Secondary | ICD-10-CM | POA: Diagnosis present

## 2017-02-28 DIAGNOSIS — K529 Noninfective gastroenteritis and colitis, unspecified: Secondary | ICD-10-CM | POA: Diagnosis present

## 2017-02-28 DIAGNOSIS — N179 Acute kidney failure, unspecified: Secondary | ICD-10-CM

## 2017-02-28 DIAGNOSIS — N400 Enlarged prostate without lower urinary tract symptoms: Secondary | ICD-10-CM | POA: Diagnosis present

## 2017-02-28 DIAGNOSIS — K56609 Unspecified intestinal obstruction, unspecified as to partial versus complete obstruction: Secondary | ICD-10-CM

## 2017-02-28 DIAGNOSIS — E876 Hypokalemia: Secondary | ICD-10-CM | POA: Diagnosis present

## 2017-02-28 DIAGNOSIS — R935 Abnormal findings on diagnostic imaging of other abdominal regions, including retroperitoneum: Secondary | ICD-10-CM | POA: Diagnosis not present

## 2017-02-28 DIAGNOSIS — Z7982 Long term (current) use of aspirin: Secondary | ICD-10-CM

## 2017-02-28 DIAGNOSIS — R1112 Projectile vomiting: Secondary | ICD-10-CM

## 2017-02-28 DIAGNOSIS — K3189 Other diseases of stomach and duodenum: Secondary | ICD-10-CM

## 2017-02-28 DIAGNOSIS — D490 Neoplasm of unspecified behavior of digestive system: Secondary | ICD-10-CM | POA: Diagnosis present

## 2017-02-28 DIAGNOSIS — Z880 Allergy status to penicillin: Secondary | ICD-10-CM

## 2017-02-28 DIAGNOSIS — K56600 Partial intestinal obstruction, unspecified as to cause: Secondary | ICD-10-CM | POA: Diagnosis not present

## 2017-02-28 DIAGNOSIS — K2970 Gastritis, unspecified, without bleeding: Secondary | ICD-10-CM | POA: Diagnosis present

## 2017-02-28 DIAGNOSIS — Z22322 Carrier or suspected carrier of Methicillin resistant Staphylococcus aureus: Secondary | ICD-10-CM | POA: Diagnosis not present

## 2017-02-28 DIAGNOSIS — Z79899 Other long term (current) drug therapy: Secondary | ICD-10-CM | POA: Diagnosis not present

## 2017-02-28 DIAGNOSIS — I10 Essential (primary) hypertension: Secondary | ICD-10-CM | POA: Diagnosis present

## 2017-02-28 HISTORY — DX: Essential (primary) hypertension: I10

## 2017-02-28 LAB — CBC WITH DIFFERENTIAL/PLATELET
Basophils Absolute: 0 10*3/uL (ref 0.0–0.1)
Basophils Relative: 0 %
Eosinophils Absolute: 0.2 10*3/uL (ref 0.0–0.7)
Eosinophils Relative: 3 %
HCT: 50.6 % (ref 39.0–52.0)
HEMOGLOBIN: 17.9 g/dL — AB (ref 13.0–17.0)
Lymphocytes Relative: 15 %
Lymphs Abs: 0.9 10*3/uL (ref 0.7–4.0)
MCH: 33.4 pg (ref 26.0–34.0)
MCHC: 35.4 g/dL (ref 30.0–36.0)
MCV: 94.4 fL (ref 78.0–100.0)
Monocytes Absolute: 1.1 10*3/uL — ABNORMAL HIGH (ref 0.1–1.0)
Monocytes Relative: 20 %
NEUTROS PCT: 62 %
Neutro Abs: 3.6 10*3/uL (ref 1.7–7.7)
Platelets: 177 10*3/uL (ref 150–400)
RBC: 5.36 MIL/uL (ref 4.22–5.81)
RDW: 13.9 % (ref 11.5–15.5)
WBC: 5.7 10*3/uL (ref 4.0–10.5)

## 2017-02-28 LAB — COMPREHENSIVE METABOLIC PANEL
ALK PHOS: 71 U/L (ref 38–126)
ALT: 36 U/L (ref 17–63)
ANION GAP: 9 (ref 5–15)
AST: 33 U/L (ref 15–41)
Albumin: 3.6 g/dL (ref 3.5–5.0)
BUN: 15 mg/dL (ref 6–20)
CALCIUM: 8.5 mg/dL — AB (ref 8.9–10.3)
CHLORIDE: 103 mmol/L (ref 101–111)
CO2: 24 mmol/L (ref 22–32)
Creatinine, Ser: 1.26 mg/dL — ABNORMAL HIGH (ref 0.61–1.24)
GFR calc Af Amer: >60 mL/min (ref >60)
GFR calc non Af Amer: >60 mL/min (ref >60)
GLUCOSE: 128 mg/dL — AB (ref 65–99)
Potassium: 3.5 mmol/L (ref 3.5–5.1)
Sodium: 136 mmol/L (ref 135–145)
Total Bilirubin: 0.9 mg/dL (ref 0.3–1.2)
Total Protein: 6.9 g/dL (ref 6.5–8.1)

## 2017-02-28 LAB — LIPASE, BLOOD: Lipase: 26 U/L (ref 11–51)

## 2017-02-28 MED ORDER — PANTOPRAZOLE SODIUM 40 MG IV SOLR
40.0000 mg | Freq: Every day | INTRAVENOUS | Status: DC
  Administered 2017-03-01: 40 mg via INTRAVENOUS
  Filled 2017-02-28 (×2): qty 40

## 2017-02-28 MED ORDER — IOPAMIDOL (ISOVUE-300) INJECTION 61%
INTRAVENOUS | Status: AC
  Administered 2017-02-28: 100 mL via INTRAVENOUS
  Filled 2017-02-28: qty 100

## 2017-02-28 MED ORDER — ACETAMINOPHEN 325 MG PO TABS: 650.0000 mg | ORAL_TABLET | Freq: Four times a day (QID) | ORAL | Status: DC | PRN

## 2017-02-28 MED ORDER — ONDANSETRON HCL 4 MG PO TABS: 4.0000 mg | ORAL_TABLET | Freq: Four times a day (QID) | ORAL | Status: DC | PRN

## 2017-02-28 MED ORDER — ENOXAPARIN SODIUM 40 MG/0.4ML ~~LOC~~ SOLN: 40.0000 mg | SUBCUTANEOUS | Status: DC

## 2017-02-28 MED ORDER — LIP MEDEX EX OINT
TOPICAL_OINTMENT | CUTANEOUS | Status: DC | PRN
  Filled 2017-02-28: qty 7

## 2017-02-28 MED ORDER — PHENOL 1.4 % MT LIQD
2.0000 | OROMUCOSAL | Status: DC | PRN
  Filled 2017-02-28: qty 177

## 2017-02-28 MED ORDER — ALBUTEROL SULFATE (2.5 MG/3ML) 0.083% IN NEBU: 2.5000 mg | INHALATION_SOLUTION | RESPIRATORY_TRACT | Status: DC | PRN

## 2017-02-28 MED ORDER — SODIUM CHLORIDE 0.9 % IV BOLUS (SEPSIS)
2000.0000 mL | Freq: Once | INTRAVENOUS | Status: AC
  Administered 2017-02-28: 2000 mL via INTRAVENOUS

## 2017-02-28 MED ORDER — HYDRALAZINE HCL 20 MG/ML IJ SOLN: 10.0000 mg | Freq: Four times a day (QID) | INTRAMUSCULAR | Status: DC | PRN

## 2017-02-28 MED ORDER — IOPAMIDOL (ISOVUE-300) INJECTION 61%
100.0000 mL | Freq: Once | INTRAVENOUS | Status: AC | PRN
  Administered 2017-02-28: 100 mL via INTRAVENOUS

## 2017-02-28 MED ORDER — ONDANSETRON HCL 4 MG/2ML IJ SOLN
4.0000 mg | Freq: Once | INTRAMUSCULAR | Status: AC
  Administered 2017-02-28: 4 mg via INTRAVENOUS
  Filled 2017-02-28: qty 2

## 2017-02-28 MED ORDER — ONDANSETRON HCL 4 MG/2ML IJ SOLN
4.0000 mg | Freq: Four times a day (QID) | INTRAMUSCULAR | Status: DC | PRN
  Administered 2017-02-28 – 2017-03-01 (×2): 4 mg via INTRAVENOUS
  Filled 2017-02-28 (×2): qty 2

## 2017-02-28 MED ORDER — SODIUM CHLORIDE 0.9 % IV SOLN
INTRAVENOUS | Status: DC
  Administered 2017-02-28 – 2017-03-01 (×3): via INTRAVENOUS
  Administered 2017-03-01: 1000 mL via INTRAVENOUS
  Administered 2017-03-02: 03:00:00 via INTRAVENOUS

## 2017-02-28 MED ORDER — HYDROMORPHONE HCL 1 MG/ML IJ SOLN
1.0000 mg | Freq: Once | INTRAMUSCULAR | Status: AC
  Administered 2017-02-28: 1 mg via INTRAVENOUS
  Filled 2017-02-28: qty 1

## 2017-02-28 MED ORDER — HYDROMORPHONE HCL 1 MG/ML IJ SOLN
0.5000 mg | INTRAMUSCULAR | Status: DC | PRN
  Administered 2017-02-28 – 2017-03-02 (×7): 1 mg via INTRAVENOUS
  Filled 2017-02-28 (×8): qty 1

## 2017-02-28 MED ORDER — ACETAMINOPHEN 650 MG RE SUPP: 650.0000 mg | Freq: Four times a day (QID) | RECTAL | Status: DC | PRN

## 2017-02-28 NOTE — Consult Note (Addendum)
Surgical Consultation Requesting provider: Dr. Johnney Killian  CC: vomiting, diarrhea, dehydration  HPI: The 61 year old gentleman with no prior abdominal surgical history who presents to the emergency room with a 2 day history of nausea, bilious/ nonbloody emesis, and diarrhea described as profuse and mucoid. He took 2 doses of Imodium yesterday which did improve the diarrhea. However, aside from some water and Gatorade he's really not been able to keep anything down and he feels weak and dehydrated. He also is experiencing increasing abdominal distention. He states that he is uncomfortable from the bloating but is not having any abdominal pain per se.  He's never had any previous similar symptoms and he denies any recent sick contacts. The only meal that he has had in the last 3 days that was not shared by other people was a lunch from [employer] on Tuesday, and the other people that he has recently dined with are not having any similar symptoms.  His evaluation in the emergency department has included a complete set of labs which aside from mildly elevated creatinine are unremarkable, and a CT scan which demonstrates moderately dilated proximal small bowel with a gentle transition in the lower abdomen and mildly thickened small bowel distally. There is gas and stool throughout the colon. 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   He is not a smoker. He drinks moderate alcohol and does not use any drugs. He is a Geophysicist/field seismologist by trade. He denies any significant family history of medical problems specifically cancer or inflammatory bowel disease.  Allergies  Allergen Reactions  . Penicillins     Has patient had a PCN reaction causing immediate rash, facial/tongue/throat swelling, SOB or lightheadedness with hypotension: unknown Has patient had a PCN reaction causing severe rash involving mucus membranes or skin necrosis: Unknown Has patient had a PCN reaction that  required hospitalization: Yes Has patient had a PCN reaction occurring within the last 10 years: No If all of the above answers are "NO", then may proceed with Cephalosporin use.   "childhood reaction"    Past Medical History:  Diagnosis Date  . Hypertension     Past Surgical History:  Procedure Laterality Date  . TONSILLECTOMY      History reviewed. No pertinent family history.  Social History   Social History  . Marital status: Divorced    Spouse name: N/A  . Number of children: N/A  . Years of education: N/A   Social History Main Topics  . Smoking status: Never Smoker  . Smokeless tobacco: Never Used  . Alcohol use Yes     Comment: occ  . Drug use: No  . Sexual activity: Not Asked   Other Topics Concern  . None   Social History Narrative  . None    No current facility-administered medications on file prior to encounter.    No current outpatient prescriptions on file prior to encounter.    Review of Systems: a complete, 10pt review of systems was completed with pertinent positives and negatives as documented in the HPI.   Physical Exam: Vitals:   02/28/17 0622 02/28/17 0855  BP: (!) 153/116 (!) 143/94  Pulse: 93 70  Resp: 20 17  Temp: 98.7 F (37.1 C) 98.6 F (37 C)   Gen: A&Ox3, no distress. Mucous membranes are dry Head: normocephalic, atraumatic, EOMI, anicteric.  Neck: supple without mass or thyromegaly Chest: unlabored respirations  symmetrical air entry  Cardiovascular: RRR with palpable distal pulses, no pedal edema Abdomen:  Soft, distended, dull to percussion, no tenderness. No peritoneal signs. No hernia, mass, organomegaly palpable. Extremities: warm, without edema, no deformities  Neuro: grossly intact Psych: appropriate mood and affect, normal insight Skin: no lesions or rashes on limited skin exam  CBC Latest Ref Rng & Units 02/28/2017 05/10/2014 04/21/2014  WBC 4.0 - 10.5 K/uL 5.7 7.0 5.4  Hemoglobin 13.0 - 17.0 g/dL 17.9(H) 13.3  13.7  Hematocrit 39.0 - 52.0 % 50.6 38.3(L) 40.7  Platelets 150 - 400 K/uL 177 164 352    CMP Latest Ref Rng & Units 02/28/2017 05/10/2014 04/18/2014  Glucose 65 - 99 mg/dL 128(H) 94 108(H)  BUN 6 - 20 mg/dL 15 16 14   Creatinine 0.61 - 1.24 mg/dL 1.26(H) 1.02 0.87  Sodium 135 - 145 mmol/L 136 142 137  Potassium 3.5 - 5.1 mmol/L 3.5 4.1 4.0  Chloride 101 - 111 mmol/L 103 105 98  CO2 22 - 32 mmol/L 24 28 27   Calcium 8.9 - 10.3 mg/dL 8.5(L) 8.9 9.2  Total Protein 6.5 - 8.1 g/dL 6.9 6.4 -  Total Bilirubin 0.3 - 1.2 mg/dL 0.9 0.3 -  Alkaline Phos 38 - 126 U/L 71 112 -  AST 15 - 41 U/L 33 15 -  ALT 17 - 63 U/L 36 17 -    Lab Results  Component Value Date   INR 1.15 04/14/2014    Imaging: CT ABDOMEN AND PELVIS WITH CONTRAST  TECHNIQUE: Multidetector CT imaging of the abdomen and pelvis was performed using the standard protocol following bolus administration of intravenous contrast.  CONTRAST:  100 mL Isovue 370 IV  COMPARISON:  CT pelvis 04/14/2014  FINDINGS: Lower chest: Mild atelectasis in the right posterior lung base. Left lung clear  Hepatobiliary: Normal liver.  Gallbladder and bile ducts normal  Pancreas: Negative  Spleen: Negative  Adrenals/Urinary Tract: Normal kidneys. No renal mass or obstruction or stone. Normal urinary bladder.  Stomach/Bowel: Pedunculated soft tissue mass in the anterior wall of the gastric antrum measures 27 x 29 mm. Probable neoplasm and biopsy recommended.  Dilated small bowel loops with multiple air-fluid levels compatible small bowel obstruction. Transition point in the mid ileum. There is mild thickening of the ileum distal to the obstruction. Colon is decompressed. Normal appendix.  Vascular/Lymphatic: Minimal atherosclerotic disease in the aorta. No lymphadenopathy.  Reproductive: Mild prostate enlargement  Other: Small amount of free fluid in the pelvis. Small amount of free fluid around the spleen. No  intra-abdominal abscess  Musculoskeletal: Moderate disc degeneration and facet degeneration L5-S1. No acute skeletal abnormality.  IMPRESSION: Pedunculated gastric mass compatible with neoplasm. Endoscopy and biopsy recommended.  Small bowel obstruction. This appears to be at the level of the mid ileum. There is thickening of the ileum. This could represent inflammatory bowel disease however the thickening is relatively mild.  Small amount of free intraperitoneal fluid. Negative for abdominal abscess.  These results were called by telephone at the time of interpretation on 02/28/2017 at 9:03 am to Dr. Vallery Ridge , who verbally acknowledged these results.  A/P: 61 year old gentleman with 2 day history of vomiting and diarrhea, no prior abdominal surgical history, no personal family history of inflammatory bowel disease. I suspect a gastroenteritis/ileus as opposed to obstruction. NG is being placed presently.Recommend medical admission for small bowel protocol, fluid resuscitation, serial labs and exams, and possible GI consult for endoscopic biopsy of incidentally discovered gastric mass. We will follow along.   Romana Juniper, MD [facility], [place] Pager [phone]

## 2017-02-28 NOTE — Consult Note (Signed)
Consultation  Referring Provider: Dr Sloan Leiter Arlean Hopping Primary Care Physician:  Velna Hatchet, MD Primary Gastroenterologist:  None/unassigned  Reason for Consultation:  SBO, gastric mass on CT  HPI: Nicholas [name] is a 61 y.o. male , generally in good health with history of hypertension. Who presented to the emergency room this morning after acute onset yesterday morning of profuse nausea vomiting and diarrhea which was nonbloody. He developed abdominal distention and pain throughout the day yesterday which has persisted. He has not had documented fever. He had eaten out the night before he became ill, no one else who he ate with  has become ill. Patient has no prior GI history other than screening colonoscopy which was done 6-7 years ago and may have had one small polyp(done in [place])told to f/u in 10 years per pt., No prior abdominal surgeries. CT of the abdomen and pelvis done in the ER this morning shows mild atelectasis in the right lung base, gallbladder or liver and pancreas appear normal. There is a pedunculated soft tissue mass in the anterior wall of the gastric antrum measuring 2.7 x 2.9 cm. He also has dilated small bowel loops, multiple air-fluid levels with a transition in the mid ileum, there is mild thickening of the ileum distal to the area that appears obstructed. There is also a small amount of free fluid around the spleen. Surgery has been consulted, NG tube be placed for decompression however it is suspected that he has an acute gastroenteritis rather than true small bowel obstruction. Labs reviewed and outlined as below. Patient says he has been feeling fine recently, has had no abdominal discomfort nausea indigestion change in bowel habits etc. Appetite has been good, weight has been stable. He says he usually bikes 60-70 miles per week and tries to stay in good shape.   Past Medical History:  Diagnosis Date  . Hypertension     Past Surgical History:    Procedure Laterality Date  . TONSILLECTOMY      Prior to Admission medications   Medication Sig Start Date End Date Taking? Authorizing Provider  lisinopril (PRINIVIL,ZESTRIL) 10 MG tablet Take 10 mg by mouth daily.   Yes [provider]    No current facility-administered medications for this encounter.    Current Outpatient Prescriptions  Medication Sig Dispense Refill  . lisinopril (PRINIVIL,ZESTRIL) 10 MG tablet Take 10 mg by mouth daily.      Allergies as of 02/28/2017 - Review Complete 02/28/2017  Allergen Reaction Noted  . Penicillins  04/14/2014    History reviewed. No pertinent family history.  Social History   Social History  . Marital status: Divorced    Spouse name: N/A  . Number of children: N/A  . Years of education: N/A   Occupational History  . Not on file.   Social History Main Topics  . Smoking status: Never Smoker  . Smokeless tobacco: Never Used  . Alcohol use Yes     Comment: occ  . Drug use: No  . Sexual activity: Not on file   Other Topics Concern  . Not on file   Social History Narrative  . No narrative on file    Review of Systems: Pertinent positive and negative review of systems were noted in the above HPI section.  All other review of systems was otherwise negative.Marland Kitchen  Physical Exam: Vital signs in last 24 hours: Temp:  [98.6 F (37 C)-98.7 F (37.1 C)] 98.6 F (37 C) (05/24 0855) Pulse Rate:  [70-93]  71 (05/24 1103) Resp:  [17-20] 17 (05/24 0855) BP: (143-153)/(94-116) 149/100 (05/24 1103) SpO2:  [96 %-98 %] 98 % (05/24 1103)   General:   Alert,  Well-developed, well-nourished,White male, pleasant and cooperative in NAD-NG in place-about 250 mL out so far Head:  Normocephalic and atraumatic. Eyes:  Sclera clear, no icterus.   Conjunctiva pink. Ears:  Normal auditory acuity. Nose:  No deformity, discharge,  or lesions. Mouth:  No deformity or lesions.   Neck:  Supple; no masses or thyromegaly. Lungs:  Clear  throughout to auscultation.   No wheezes, crackles, or rhonchi. Heart:  Regular rate and rhythm; no murmurs, clicks, rubs,  or gallops. Abdomen:  Soft, somewhat distended, BS present,nonpalp mass or hsm , he is tender in the mid abdomen without guarding   Rectal:  Deferred  Msk:  Symmetrical without gross deformities. . Pulses:  Normal pulses noted. Extremities:  Without clubbing or edema. Neurologic:  Alert and  oriented x4;  grossly normal neurologically. Skin:  Intact without significant lesions or rashes.. Psych:  Alert and cooperative. Normal mood and affect.  Intake/Output from previous day: No intake/output data recorded. Intake/Output this shift: Total I/O In: 2000 [IV Piggyback:2000] Out: -   Lab Results:  Recent Labs  02/28/17 0629  WBC 5.7  HGB 17.9*  HCT 50.6  PLT 177   BMET  Recent Labs  02/28/17 0629  NA 136  K 3.5  CL 103  CO2 24  GLUCOSE 128*  BUN 15  CREATININE 1.26*  CALCIUM 8.5*   LFT  Recent Labs  02/28/17 0629  PROT 6.9  ALBUMIN 3.6  AST 33  ALT 36  ALKPHOS 71  BILITOT 0.9   PT/INR No results for input(s): LABPROT, INR in the last 72 hours. Hepatitis Panel No results for input(s): HEPBSAG, HCVAB, HEPAIGM, HEPBIGM in the last 72 hours.   IMPRESSION:   #68 61 year old white male generally in good health with acute onset of abdominal pain nausea vomiting and diarrhea yesterday morning. Patient had multiple episodes of vomiting. Abdominal pain progressed and developed abdominal distention. CT scan today consistent with partial small bowel obstruction at the level of the mid ileum where there is also mild thickening of the ileum distal to the transition zone. Patient has not had any prior surgeries. Unclear whether this is a true obstruction versus acute severe enteritis with edema causing obstructive picture. 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 I have discontinued the Lovenox order for today, as patient to be scheduled for EGD with Dr. Havery Moros in a.m. tomorrow, and will need biopsies at the minimum of the polypoid gastric lesion. EGD discussed in detail with the patient, including indications, risks and benefits and he is agreeable to proceed. If he has rapid improvement in symptoms over the next 24 hours when may be able to remove the NG at time of endoscopy. Thank you we will follow with you        Juda Toepfer  02/28/2017, 11:39 AM

## 2017-02-28 NOTE — ED Triage Notes (Signed)
Pt complains of vomiting and diarrhea for two days, possible food poison, he also states that his abdomen is distended Pt states that he hasn't been able to keep his blood pressure med down

## 2017-02-28 NOTE — H&P (Signed)
HISTORY AND PHYSICAL       PATIENT DETAILS Name: Nicholas [name] Age: 61 y.o. Sex: male Date of Birth: [birth year] Admit Date: 02/28/2017 ZOX:[id number], Nicki Reaper, MD   Patient coming from: Home   CHIEF COMPLAINT:  Vomiting, diarrhea for the past 2 days  HPI: Nicholas [name] is a 61 y.o. male with medical history of hypertension who was brought to the ED for evaluation of the above-noted complaints. Per patient, he had dinner at a restaurant Tuesday evening, on Wednesday started having profuse nausea, vomiting and diarrhea. He then started having abdominal distention and pain, as result he presented to the emergency room, a CT scan of the emergency room showed a small bowel obstruction. It also showed that he had a pedunculated stomach mass. General surgery was consulted, and the hospitalist service was asked to admit this patient for further inpatient evaluation and treatment.   Per patient, pain is mostly in the periventricular area, he describes it as a band around his abdomen, he describes the pain around 8/10 in intensity at its worst, no radiation, as noted above associated with numerous episodes of nonbloody and nonbilious vomiting and diarrhea. Note diarrhea does not continue any blood.  Patient denies any epigastric pain or dyspeptic symptoms, he denies any weight loss. Apparently his appetite is also pretty good.  Patient is very active physically, and bikes approximately 60 miles a week  ED Course:  NG tube placed, GI consulted-given IV fluids and antiemetics along with narcotics.  Note: Lives at: Home Mobility:  Independent Chronic Indwelling Foley:no   REVIEW OF SYSTEMS:  Constitutional:   No  weight loss, night sweats,  Fevers, chills, fatigue.  HEENT:    No headaches, Dysphagia,Tooth/dental problems,Sore throat  Cardio-vascular: No chest pain,Orthopnea, PND,lower extremity edema, anasarca, palpitations  GI:  No heartburn, indigestion,  melena or  hematochezia  Resp: No shortness of breath, cough, hemoptysis,plueritic chest pain.   Skin:  No rash or lesions.  GU:  No dysuria, change in color of urine, no urgency or frequency.  No flank pain.  Musculoskeletal: No joint pain or swelling.  No decreased range of motion.  No back pain.  Endocrine: No heat intolerance, no cold intolerance, no polyuria, no polydipsia  Psych: No change in mood or affect. No depression or anxiety.  No memory loss.   ALLERGIES:   Allergies  Allergen Reactions  . Penicillins     Has patient had a PCN reaction causing immediate rash, facial/tongue/throat swelling, SOB or lightheadedness with hypotension: unknown Has patient had a PCN reaction causing severe rash involving mucus membranes or skin necrosis: Unknown Has patient had a PCN reaction that required hospitalization: Yes Has patient had a PCN reaction occurring within the last 10 years: No If all of the above answers are "NO", then may proceed with Cephalosporin use.   "childhood reaction"    PAST MEDICAL HISTORY: Past Medical History:  Diagnosis Date  . Hypertension     PAST SURGICAL HISTORY: Past Surgical History:  Procedure Laterality Date  . TONSILLECTOMY      MEDICATIONS AT HOME: Prior to Admission medications   Medication Sig Start Date End Date Taking? Authorizing Provider  lisinopril (PRINIVIL,ZESTRIL) 10 MG tablet Take 10 mg by mouth daily.   Yes [provider]    FAMILY HISTORY: No family history of stomach cancer  SOCIAL HISTORY:  reports that he has never smoked. He has never used smokeless tobacco. He reports that he drinks alcohol. He reports  that he does not use drugs.  PHYSICAL EXAM: Blood pressure (!) 149/100, pulse 71, temperature 98.6 F (37 C), temperature source Oral, resp. rate 17, SpO2 98 %.  General appearance :Awake, alert, not in any distress. Speech Clear. Not toxic Looking Eyes:, pupils equally reactive to light and  accomodation,no scleral icterus.Pink conjunctiva HEENT: Atraumatic and Normocephalic Neck: supple, no JVD. No cervical lymphadenopathy. No thyromegaly Resp:Good air entry bilaterally, no added sounds  CVS: S1 S2 regular, no murmurs.  GI: Bowel sounds present, mildly tender in the periumbilical area, mildly distended. There are no peritoneal signs.  Extremities: B/L Lower Ext shows no edema, both legs are warm to touch Neurology:  speech clear,Non focal, sensation is grossly intact. Psychiatric: Normal judgment and insight. Alert and oriented x 3. Normal mood. Musculoskeletal:gait appears to be normal.No digital cyanosis Skin:No Rash, warm and dry Wounds:N/A  LABS ON ADMISSION:  I have personally reviewed following labs and imaging studies  CBC:  Recent Labs Lab 02/28/17 0629  WBC 5.7  NEUTROABS 3.6  HGB 17.9*  HCT 50.6  MCV 94.4  PLT 962    Basic Metabolic Panel:  Recent Labs Lab 02/28/17 0629  NA 136  K 3.5  CL 103  CO2 24  GLUCOSE 128*  BUN 15  CREATININE 1.26*  CALCIUM 8.5*    GFR: CrCl cannot be calculated (Unknown ideal weight.).  Liver Function Tests:  Recent Labs Lab 02/28/17 0629  AST 33  ALT 36  ALKPHOS 71  BILITOT 0.9  PROT 6.9  ALBUMIN 3.6    Recent Labs Lab 02/28/17 0629  LIPASE 26   No results for input(s): AMMONIA in the last 168 hours.  Coagulation Profile: No results for input(s): INR, PROTIME in the last 168 hours.  Cardiac Enzymes: No results for input(s): CKTOTAL, CKMB, CKMBINDEX, TROPONINI in the last 168 hours.  BNP (last 3 results) No results for input(s): PROBNP in the last 8760 hours.  HbA1C: No results for input(s): HGBA1C in the last 72 hours.  CBG: No results for input(s): GLUCAP in the last 168 hours.  Lipid Profile: No results for input(s): CHOL, HDL, LDLCALC, TRIG, CHOLHDL, LDLDIRECT in the last 72 hours.  Thyroid Function Tests: No results for input(s): TSH, T4TOTAL, FREET4, T3FREE, THYROIDAB in the  last 72 hours.  Anemia Panel: No results for input(s): VITAMINB12, FOLATE, FERRITIN, TIBC, IRON, RETICCTPCT in the last 72 hours.  Urine analysis: No results found for: COLORURINE, APPEARANCEUR, LABSPEC, PHURINE, GLUCOSEU, HGBUR, BILIRUBINUR, KETONESUR, PROTEINUR, UROBILINOGEN, NITRITE, LEUKOCYTESUR  Sepsis Labs: Lactic Acid, Venous No results found for: [place]   Microbiology: No results found for this or any previous visit (from the past 240 hour(s)).    RADIOLOGIC STUDIES ON ADMISSION: Dg Abdomen 1 View  Result Date: 02/28/2017 CLINICAL DATA:  Check enteric catheter placement EXAM: ABDOMEN - 1 VIEW COMPARISON:  None. FINDINGS: Scattered large and small bowel gas is noted. Multiple loops of dilated small bowel seen. Nasogastric catheter is noted within the stomach. The proximal side port lies at the gastroesophageal junction. IMPRESSION: Changes of small bowel dilatation. Nasogastric catheter within the stomach. Electronically Signed   By: Inez Catalina M.D.   On: 02/28/2017 10:59   Ct Abdomen Pelvis W Contrast  Result Date: 02/28/2017 CLINICAL DATA:  Abdominal pain and distention. Nausea vomiting diarrhea. No prior abdominal surgery. EXAM: CT ABDOMEN AND PELVIS WITH CONTRAST TECHNIQUE: Multidetector CT imaging of the abdomen and pelvis was performed using the standard protocol following bolus administration of intravenous contrast. CONTRAST:  100  mL Isovue 370 IV COMPARISON:  CT pelvis 04/14/2014 FINDINGS: Lower chest: Mild atelectasis in the right posterior lung base. Left lung clear Hepatobiliary: Normal liver.  Gallbladder and bile ducts normal Pancreas: Negative Spleen: Negative Adrenals/Urinary Tract: Normal kidneys. No renal mass or obstruction or stone. Normal urinary bladder. Stomach/Bowel: Pedunculated soft tissue mass in the anterior wall of the gastric antrum measures 27 x 29 mm. Probable neoplasm and biopsy recommended. Dilated small bowel loops with multiple air-fluid  levels compatible small bowel obstruction. Transition point in the mid ileum. There is mild thickening of the ileum distal to the obstruction. Colon is decompressed. Normal appendix. Vascular/Lymphatic: Minimal atherosclerotic disease in the aorta. No lymphadenopathy. Reproductive: Mild prostate enlargement Other: Small amount of free fluid in the pelvis. Small amount of free fluid around the spleen. No intra-abdominal abscess Musculoskeletal: Moderate disc degeneration and facet degeneration L5-S1. No acute skeletal abnormality. IMPRESSION: Pedunculated gastric mass compatible with neoplasm. Endoscopy and biopsy recommended. Small bowel obstruction. This appears to be at the level of the mid ileum. There is thickening of the ileum. This could represent inflammatory bowel disease however the thickening is relatively mild. Small amount of free intraperitoneal fluid. Negative for abdominal abscess. These results were called by telephone at the time of interpretation on 02/28/2017 at 9:03 am to Dr. Vallery Ridge , who verbally acknowledged these results. Electronically Signed   By: Franchot Gallo M.D.   On: 02/28/2017 09:04   I have personally reviewed the CT of the abdomen images.   EKG:  Not done  ASSESSMENT AND PLAN: Small bowel obstruction:Likely secondary to bowel information/enteritis. Keep nothing by mouth, NG tube to low intermittent suction, as needed narcotics and antiemetics. Gen. surgery following.  Nausea vomiting diarrhea: Possibly a viral syndrome, and diarrhea reoccurs-Check GI pathogen panel. GI symptoms are acute, doubt it is related to his stomach mass/malignancy (?carcinoid etc that could cause diarrhea)  Stomach mass: Likely incidental finding, will require endoscopic evaluation at some point. He appears to be asymptomatic from the stomach mass-denies any dyspeptic symptoms/weight loss/loss of appetite. GI consulted for possible endoscopy evaluation at some point.  Acute kidney injury:  Likely hemodynamically mediated kidney injury in the setting of vomiting/GI loss. Hydrate and recheck electrolytes.  Hypertension: Hold lisinopril, use IV hydralazine as needed while nothing by mouth. Resume lisinopril when able  Further plan will depend as patient's clinical course evolves and further radiologic and laboratory data become available. Patient will be monitored closely.  Above noted plan was discussed with patient/ face to face at bedside, he was in agreement.   CONSULTS: None  DVT Prophylaxis: Prophylactic Lovenox   Code Status: Full Code  Disposition Plan:  Discharge back home possibly in 2-3 days, depending on clinical course  Admission status: Inpatient going to  medical floor    Total time spent  55 minutes.Greater than 50% of this time was spent in counseling, explanation of diagnosis, planning of further management, and coordination of care.  Oren Binet [facility] Pager [phone]  If 7PM-7AM, please contact night-coverage [email or web address] Password [email or web address] 02/28/2017, 11:18 AM

## 2017-02-28 NOTE — ED Provider Notes (Signed)
Patient care assumed from Dr. Venora Maples at shift change. Result of CT shows small bowel obstruction and gastric mass. I have reassessed the patient and reviewed results with him. Patient is alert and appropriate. He is nontoxic. He does not have peritoneal signs. Patient reported first dose of Dilaudid was effective for pain control. Second dose of Dilaudid ordered and NG tube ordered. Consultation has been placed to general surgery [facility]) who will evaluate the patient in the emergency department.   Charlesetta Shanks, MD 02/28/17 [phone]

## 2017-02-28 NOTE — ED Provider Notes (Signed)
[facility] DEPT Provider Note   CSN: [id number] Arrival date & time: 02/28/17  0607     History   Chief Complaint Chief Complaint  Patient presents with  . Abdominal Pain    HPI Nicholas [name] is a 61 y.o. male.  HPI Patient is a 61 year old male with no prior history of abdominal surgery presents the emergency department with nausea vomiting and diarrhea over the past 2 days with increasing abdominal distention.  He reports inability keep any fluids down or his blood pressure medicines over the past 24 hours.  He feels weak.  His symptoms are moderate in severity.  No prior history of abdominal surgery.  Never had discomfort or pain or swelling like this before.  Denies fevers and chills.   Past Medical History:  Diagnosis Date  . Hypertension     Patient Active Problem List   Diagnosis Date Noted  . 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 Abscess of right thigh 04/20/2014  . Abscess of left thigh 04/14/2014    Past Surgical History:  Procedure Laterality Date  . TONSILLECTOMY         Home Medications    Prior to Admission medications   Medication Sig Start Date End Date Taking? Authorizing Provider  aspirin 325 MG tablet Take 325 mg by mouth daily.    [provider]  lisinopril (PRINIVIL,ZESTRIL) 5 MG tablet Take 5 mg by mouth daily.    [provider]  vancomycin 1,000 mg in sodium chloride 0.9 % 250 mL Inject 1,250 mg into the vein every 12 (twelve) hours.     [provider]  Vitamin D, Ergocalciferol, (DRISDOL) 50000 UNITS CAPS capsule Take 50,000 Units by mouth every 7 (seven) days.    [provider]    Family History History reviewed. No pertinent family history.  Social History Social History  Substance Use Topics  . Smoking status: Never Smoker  . Smokeless tobacco: Never Used  . Alcohol use Yes     Comment: occ     Allergies   Penicillins   Review of  Systems Review of Systems  All other systems reviewed and are negative.    Physical Exam Updated Vital Signs BP (!) 153/116 (BP Location: Left Arm)   Pulse 93   Temp 98.7 F (37.1 C) (Oral)   Resp 20   SpO2 96%   Physical Exam  Constitutional: He is oriented to person, place, and time. He appears well-developed and well-nourished.  HENT:  Head: Normocephalic and atraumatic.  Eyes: EOM are normal.  Neck: Normal range of motion.  Cardiovascular: Normal rate, regular rhythm and normal heart sounds.   Pulmonary/Chest: Effort normal and breath sounds normal. No respiratory distress.  Abdominal: Soft. He exhibits distension. He exhibits no mass. There is tenderness. There is no rebound and no guarding.  Musculoskeletal: Normal range of motion.  Neurological: He is alert and oriented to person, place, and time.  Skin: Skin is warm and dry.  Psychiatric: He has a normal mood and affect. Judgment normal.  Nursing note and vitals reviewed.    ED Treatments / Results  Labs (all labs ordered are listed, but only abnormal results are displayed) Labs Reviewed  CBC WITH DIFFERENTIAL/PLATELET - Abnormal; Notable for the following:       Result Value   Hemoglobin 17.9 (*)    Monocytes Absolute 1.1 (*)    All other components within normal limits  COMPREHENSIVE METABOLIC PANEL - Abnormal;  Notable for the following:    Glucose, Bld 128 (*)    Creatinine, Ser 1.26 (*)    Calcium 8.5 (*)    All other components within normal limits  LIPASE, BLOOD    EKG  EKG Interpretation None       Radiology No results found.  Procedures Procedures (including critical care time)  Medications Ordered in ED Medications  HYDROmorphone (DILAUDID) injection 1 mg (1 mg Intravenous Given 02/28/17 0639)  ondansetron (ZOFRAN) injection 4 mg (4 mg Intravenous Given 02/28/17 0640)  sodium chloride 0.9 % bolus 2,000 mL (2,000 mLs Intravenous New Bag/Given 02/28/17 6438)     Initial Impression /  Assessment and Plan / ED Course  I have reviewed the triage vital signs and the nursing notes.  Pertinent labs & imaging results that were available during my care of the patient were reviewed by me and considered in my medical decision making (see chart for details).     8:06 AM Patient awaiting CT scan to further evaluate cause of nausea vomiting and abdominal distention.  Care transferred to Dr. Johnney Killian.   Final Clinical Impressions(s) / ED Diagnoses   Final diagnoses:  None    New Prescriptions New Prescriptions   No medications on file     Jola Schmidt, MD 02/28/17 [phone]

## 2017-03-01 ENCOUNTER — Inpatient Hospital Stay (HOSPITAL_COMMUNITY): Payer: PRIVATE HEALTH INSURANCE

## 2017-03-01 LAB — CBC
HEMATOCRIT: 42.2 % (ref 39.0–52.0)
HEMOGLOBIN: 14.4 g/dL (ref 13.0–17.0)
MCH: 32.7 pg (ref 26.0–34.0)
MCHC: 34.1 g/dL (ref 30.0–36.0)
MCV: 95.7 fL (ref 78.0–100.0)
Platelets: 169 10*3/uL (ref 150–400)
RBC: 4.41 MIL/uL (ref 4.22–5.81)
RDW: 14 % (ref 11.5–15.5)
WBC: 5.9 10*3/uL (ref 4.0–10.5)

## 2017-03-01 LAB — BASIC METABOLIC PANEL
ANION GAP: 7 (ref 5–15)
BUN: 13 mg/dL (ref 6–20)
CHLORIDE: 109 mmol/L (ref 101–111)
CO2: 24 mmol/L (ref 22–32)
Calcium: 7.7 mg/dL — ABNORMAL LOW (ref 8.9–10.3)
Creatinine, Ser: 1.02 mg/dL (ref 0.61–1.24)
GFR calc Af Amer: >60 mL/min (ref >60)
GFR calc non Af Amer: >60 mL/min (ref >60)
GLUCOSE: 106 mg/dL — AB (ref 65–99)
POTASSIUM: 3.3 mmol/L — AB (ref 3.5–5.1)
Sodium: 140 mmol/L (ref 135–145)

## 2017-03-01 LAB — HIV ANTIBODY (ROUTINE TESTING W REFLEX): HIV Screen 4th Generation wRfx: NONREACTIVE

## 2017-03-01 MED ORDER — DIPHENHYDRAMINE HCL 50 MG/ML IJ SOLN
INTRAMUSCULAR | Status: AC
  Filled 2017-03-01: qty 1

## 2017-03-01 MED ORDER — DIATRIZOATE MEGLUMINE & SODIUM 66-10 % PO SOLN
90.0000 mL | Freq: Once | ORAL | Status: AC
  Administered 2017-03-01: 90 mL via NASOGASTRIC
  Filled 2017-03-01: qty 90

## 2017-03-01 MED ORDER — DIPHENHYDRAMINE HCL 50 MG/ML IJ SOLN
25.0000 mg | Freq: Once | INTRAMUSCULAR | Status: AC
  Administered 2017-03-01: 25 mg via INTRAVENOUS

## 2017-03-01 MED ORDER — POTASSIUM CHLORIDE 10 MEQ/100ML IV SOLN
10.0000 meq | INTRAVENOUS | Status: AC
  Administered 2017-03-01 (×4): 10 meq via INTRAVENOUS
  Filled 2017-03-01 (×4): qty 100

## 2017-03-01 NOTE — Progress Notes (Signed)
Progress Note   Subjective  Patient feels improved this morning, less abdominal distension, but not yet back to his "normal" state. He is passing gas. NG is uncomfortable, about 500cc out. Otherwise no acute events overnight.   Objective   Vital signs in last 24 hours: Temp:  [97.7 F (36.5 C)-98.6 F (37 C)] 98 F (36.7 C) (05/25 0549) Pulse Rate:  [59-95] 63 (05/25 0549) Resp:  [17-18] 18 (05/25 0549) BP: (110-149)/(66-100) 134/80 (05/25 0549) SpO2:  [94 %-98 %] 94 % (05/25 0549) Last BM Date: 02/28/17 General:    white male in NAD Heart:  Regular rate and rhythm; no murmurs Lungs: Respirations even and unlabored, lungs CTA bilaterally Abdomen:  Softer than prior exam, nontender, mildly distended.  Extremities:  Without edema. Neurologic:  Alert and oriented,  grossly normal neurologically. Psych:  Cooperative. Normal mood and affect.  Intake/Output from previous day: 05/24 0701 - 05/25 0700 In: 3289.6 [I.V.:1289.6; IV Piggyback:2000] Out: 900 [Urine:400; Emesis/NG output:500] Intake/Output this shift: No intake/output data recorded.  Lab Results:  Recent Labs  02/28/17 0629 03/01/17 0356  WBC 5.7 5.9  HGB 17.9* 14.4  HCT 50.6 42.2  PLT 177 169   BMET  Recent Labs  02/28/17 0629 03/01/17 0356  NA 136 140  K 3.5 3.3*  CL 103 109  CO2 24 24  GLUCOSE 128* 106*  BUN 15 13  CREATININE 1.26* 1.02  CALCIUM 8.5* 7.7*   LFT  Recent Labs  02/28/17 0629  PROT 6.9  ALBUMIN 3.6  AST 33  ALT 36  ALKPHOS 71  BILITOT 0.9   PT/INR No results for input(s): LABPROT, INR in the last 72 hours.  Studies/Results: Dg Abdomen 1 View  Result Date: 02/28/2017 CLINICAL DATA:  Check enteric catheter placement EXAM: ABDOMEN - 1 VIEW COMPARISON:  None. FINDINGS: Scattered large and small bowel gas is noted. Multiple loops of dilated small bowel seen. Nasogastric catheter is noted within the stomach. The proximal side port lies at the gastroesophageal junction.  IMPRESSION: Changes of small bowel dilatation. Nasogastric catheter within the stomach. Electronically Signed   By: Inez Catalina M.D.   On: 02/28/2017 10:59   Ct Abdomen Pelvis W Contrast  Result Date: 02/28/2017 CLINICAL DATA:  Abdominal pain and distention. Nausea vomiting diarrhea. No prior abdominal surgery. EXAM: CT ABDOMEN AND PELVIS WITH CONTRAST TECHNIQUE: Multidetector CT imaging of the abdomen and pelvis was performed using the standard protocol following bolus administration of intravenous contrast. CONTRAST:  100 mL Isovue 370 IV COMPARISON:  CT pelvis 04/14/2014 FINDINGS: Lower chest: Mild atelectasis in the right posterior lung base. Left lung clear Hepatobiliary: Normal liver.  Gallbladder and bile ducts normal Pancreas: Negative Spleen: Negative Adrenals/Urinary Tract: Normal kidneys. No renal mass or obstruction or stone. Normal urinary bladder. Stomach/Bowel: Pedunculated soft tissue mass in the anterior wall of the gastric antrum measures 27 x 29 mm. Probable neoplasm and biopsy recommended. Dilated small bowel loops with multiple air-fluid levels compatible small bowel obstruction. Transition point in the mid ileum. There is mild thickening of the ileum distal to the obstruction. Colon is decompressed. Normal appendix. Vascular/Lymphatic: Minimal atherosclerotic disease in the aorta. No lymphadenopathy. Reproductive: Mild prostate enlargement Other: Small amount of free fluid in the pelvis. Small amount of free fluid around the spleen. No intra-abdominal abscess Musculoskeletal: Moderate disc degeneration and facet degeneration L5-S1. No acute skeletal abnormality. IMPRESSION: Pedunculated gastric mass compatible with neoplasm. Endoscopy and biopsy recommended. Small bowel obstruction. This appears to be at  the level of the mid ileum. There is thickening of the ileum. This could represent inflammatory bowel disease however the thickening is relatively mild. Small amount of free  intraperitoneal fluid. Negative for abdominal abscess. These results were called by telephone at the time of interpretation on 02/28/2017 at 9:03 am to Dr. Vallery Ridge , who verbally acknowledged these results. Electronically Signed   By: Franchot Gallo M.D.   On: 02/28/2017 09:04       Assessment / Plan:   61 y/o male presenting with acute ileitis / possible SBO - ddx for ileitis includes infectious versus Crohn's versus less likely malignancy.   Thus far he appears better since admission with NG decompression but not yet back to baseline, mild distension persists. Surgery planning small bowel protocol with gastrograffin. Stool studies pending.   Incidentally noted on CT scan there is a polypoid verse mass-like lesion in the antrum. Hopefully this is a benign polyp which can be endoscopically removed, but malignancy is possible and would recommend endoscopy prior to discharge to expedite his workup. I think a bit more time to allow the small bowel to further decompress would be good prior to EGD, will tentatively schedule him for EGD tomorrow, pending his course today. I have discussed risks / benefits of EGD with him, and he wishes to proceed.   Moving forward, the patient will need colonoscopy versus interval enterography study with CT or MRI to re-evaluate ileal findings in upcoming weeks.   Please call with questions.  Peru Cellar, MD [facility] Pager [phone]

## 2017-03-01 NOTE — Progress Notes (Signed)
Patient ID: Nicholas [name], male   DOB: Feb 24, 1956, 61 y.o.   MRN: [id number]  [facility] Surgery Progress Note     Subjective: CC- abdominal pain Feeling a little better this morning. States that he has less abdominal distension. Still passing some flatus, no BM. NG tube with 500cc/24hr. EGD possibly tomorrow with GI to eval gastric mass.  Objective: Vital signs in last 24 hours: Temp:  [97.7 F (36.5 C)-98.6 F (37 C)] 98 F (36.7 C) (05/25 0549) Pulse Rate:  [59-95] 63 (05/25 0549) Resp:  [17-18] 18 (05/25 0549) BP: (110-149)/(66-100) 134/80 (05/25 0549) SpO2:  [94 %-98 %] 94 % (05/25 0549) Last BM Date: 02/28/17  Intake/Output from previous day: 05/24 0701 - 05/25 0700 In: 3289.6 [I.V.:1289.6; IV Piggyback:2000] Out: 900 [Urine:400; Emesis/NG output:500] Intake/Output this shift: No intake/output data recorded.  PE: Gen:  Alert, NAD, pleasant Card:  RRR, no M/G/R heard Pulm:  CTAB, no W/R/R, effort normal Abd: Soft, mild distension, NT, +BS, no HSM, no hernia Ext:  No erythema, edema, or tenderness BUE/BLE   Lab Results:   Recent Labs  02/28/17 0629 03/01/17 0356  WBC 5.7 5.9  HGB 17.9* 14.4  HCT 50.6 42.2  PLT 177 169   BMET  Recent Labs  02/28/17 0629 03/01/17 0356  NA 136 140  K 3.5 3.3*  CL 103 109  CO2 24 24  GLUCOSE 128* 106*  BUN 15 13  CREATININE 1.26* 1.02  CALCIUM 8.5* 7.7*   PT/INR No results for input(s): LABPROT, INR in the last 72 hours. CMP     Component Value Date/Time   NA 140 03/01/2017 0356   K 3.3 (L) 03/01/2017 0356   CL 109 03/01/2017 0356   CO2 24 03/01/2017 0356   GLUCOSE 106 (H) 03/01/2017 0356   BUN 13 03/01/2017 0356   CREATININE 1.02 03/01/2017 0356   CREATININE 1.02 05/10/2014 1649   CALCIUM 7.7 (L) 03/01/2017 0356   PROT 6.9 02/28/2017 0629   ALBUMIN 3.6 02/28/2017 0629   AST 33 02/28/2017 0629   ALT 36 02/28/2017 0629   ALKPHOS 71 02/28/2017 0629   BILITOT 0.9 02/28/2017 0629   GFRNONAA >60  03/01/2017 0356   GFRAA >60 03/01/2017 0356   Lipase     Component Value Date/Time   LIPASE 26 02/28/2017 0629       Studies/Results: Dg Abdomen 1 View  Result Date: 02/28/2017 CLINICAL DATA:  Check enteric catheter placement EXAM: ABDOMEN - 1 VIEW COMPARISON:  None. FINDINGS: Scattered large and small bowel gas is noted. Multiple loops of dilated small bowel seen. Nasogastric catheter is noted within the stomach. The proximal side port lies at the gastroesophageal junction. IMPRESSION: Changes of small bowel dilatation. Nasogastric catheter within the stomach. Electronically Signed   By: Inez Catalina M.D.   On: 02/28/2017 10:59   Ct Abdomen Pelvis W Contrast  Result Date: 02/28/2017 CLINICAL DATA:  Abdominal pain and distention. Nausea vomiting diarrhea. No prior abdominal surgery. EXAM: CT ABDOMEN AND PELVIS WITH CONTRAST TECHNIQUE: Multidetector CT imaging of the abdomen and pelvis was performed using the standard protocol following bolus administration of intravenous contrast. CONTRAST:  100 mL Isovue 370 IV COMPARISON:  CT pelvis 04/14/2014 FINDINGS: Lower chest: Mild atelectasis in the right posterior lung base. Left lung clear Hepatobiliary: Normal liver.  Gallbladder and bile ducts normal Pancreas: Negative Spleen: Negative Adrenals/Urinary Tract: Normal kidneys. No renal mass or obstruction or stone. Normal urinary bladder. Stomach/Bowel: Pedunculated soft tissue mass in the anterior wall of the  gastric antrum measures 27 x 29 mm. Probable neoplasm and biopsy recommended. Dilated small bowel loops with multiple air-fluid levels compatible small bowel obstruction. Transition point in the mid ileum. There is mild thickening of the ileum distal to the obstruction. Colon is decompressed. Normal appendix. Vascular/Lymphatic: Minimal atherosclerotic disease in the aorta. No lymphadenopathy. Reproductive: Mild prostate enlargement Other: Small amount of free fluid in the pelvis. Small amount of  free fluid around the spleen. No intra-abdominal abscess Musculoskeletal: Moderate disc degeneration and facet degeneration L5-S1. No acute skeletal abnormality. IMPRESSION: Pedunculated gastric mass compatible with neoplasm. Endoscopy and biopsy recommended. Small bowel obstruction. This appears to be at the level of the mid ileum. There is thickening of the ileum. This could represent inflammatory bowel disease however the thickening is relatively mild. Small amount of free intraperitoneal fluid. Negative for abdominal abscess. These results were called by telephone at the time of interpretation on 02/28/2017 at 9:03 am to Dr. Vallery Ridge , who verbally acknowledged these results. Electronically Signed   By: Franchot Gallo M.D.   On: 02/28/2017 09:04    Anti-infectives: Anti-infectives    None       Assessment/Plan Gastroenteritis/ileus vs SBO - infectious vs. Less likely Crohn's or malignancy. Awaiting stool studies. Per GI once recovered from this acute episode, would recommend colonoscopy with ileal intubation  - will start small bowel protocol today  Gastric mass - EGD possibly tomorrow to evaluate   HTN AKI - improved with IVF  ID - none FEN - IVF, NPO/NGT VTE - SCDs, hold chemical DVT prophylaxis for procedure  Plan - Spoke with Dr. Havery Moros, possible EGD tomorrow to evaluate gastric mass. Ok to start small bowel protocol today with gastrograffin.    LOS: 1 day    Jerrye Beavers , [facility] Surgery 03/01/2017, 7:55 AM Pager: [phone] Consults: [phone] Mon-Fri 7:00 am-4:30 pm Sat-Sun 7:00 am-11:30 am

## 2017-03-01 NOTE — Progress Notes (Signed)
Patient ID: Nicholas [name], male   DOB: 23-Oct-1955, 61 y.o.   MRN: [id number]    PROGRESS NOTE    Nicholas [name]  ZJI:[id number] DOB: 1955/12/18 DOA: 02/28/2017  PCP: Velna Hatchet, MD   Brief Narrative:  Pt is very pleasant 61 yo male, employer of [employer], known hx of HTN, presented to ED with main concern of abd pain, diarrhea, nausea and non bloody vomiting. In ED, CT abd notable for soft tissue mass in the ant wall of the gastric antrum, dilated small bowel loops, ? Gastroenteritis. GI and surgery team consulted and [facility] asked to admit pt.   Assessment & Plan:   Principal Problem:   Acute epigastric pain, N/V/D - ? Acute ileitis vs possible SBO - infectious vs Crohn's, thought to be less likely malignancy  - pt improving with IVF and conservative care - NGT is in place - will continue same regimen with IVF, antiemetics and analgesia as needed - keep NGT in place - appreciate surgery and GI team assistance  - mass that was noted on CT and though to be polypoid and hopefully benign - GI team will scheduled pt for endoscopy for AM  Active Problems:   HTN (hypertension), essential - reasonable inpatient control     Hypokalemia - supplement, BMP in AM    AKI (acute kidney injury) ([place]) - pre renal in etiology - resolved with IVF  DVT prophylaxis: SCD's Code Status: Full  Family Communication: Patient at bedside  Disposition Plan: home once surgery and GI team clear   Consultants:   GI  Surgery   Procedures:   None  Antimicrobials:   None   Subjective: Pt reports feeling better.   Objective: Vitals:   02/28/17 1322 02/28/17 2055 03/01/17 0549 03/01/17 1357  BP:  110/66 134/80 (!) 141/98  Pulse:  (!) 59 63 63  Resp:  18 18 18   Temp:  98 F (36.7 C) 98 F (36.7 C) 98.2 F (36.8 C)  TempSrc:  Oral Oral Oral  SpO2:  98% 94% 97%  Height: 6' (1.829 m)       Intake/Output Summary (Last 24 hours) at 03/01/17 1621 Last data filed at 03/01/17 1357  Gross per 24  hour  Intake             2250 ml  Output             1400 ml  Net              850 ml   There were no vitals filed for this visit.  Examination:  General exam: Appears calm and comfortable  Respiratory system: Clear to auscultation. Respiratory effort normal. Cardiovascular system: S1 & S2 heard, RRR. No JVD, murmurs, rubs, gallops or clicks. No pedal edema. Gastrointestinal system: Abdomen is slightly distended but non tender Central nervous system: Alert and oriented. No focal neurological deficits. Extremities: Symmetric 5 x 5 power. Skin: No rashes, lesions or ulcers Psychiatry: Judgement and insight appear normal. Mood & affect appropriate.    Data Reviewed: I have personally reviewed following labs and imaging studies  CBC:  Recent Labs Lab 02/28/17 0629 03/01/17 0356  WBC 5.7 5.9  NEUTROABS 3.6  --   HGB 17.9* 14.4  HCT 50.6 42.2  MCV 94.4 95.7  PLT 177 175   Basic Metabolic Panel:  Recent Labs Lab 02/28/17 0629 03/01/17 0356  NA 136 140  K 3.5 3.3*  CL 103 109  CO2 24 24  GLUCOSE 128* 106*  BUN  15 13  CREATININE 1.26* 1.02  CALCIUM 8.5* 7.7*   Liver Function Tests:  Recent Labs Lab 02/28/17 0629  AST 33  ALT 36  ALKPHOS 71  BILITOT 0.9  PROT 6.9  ALBUMIN 3.6    Recent Labs Lab 02/28/17 0629  LIPASE 26   Radiology Studies: Dg Abdomen 1 View  Result Date: 02/28/2017 CLINICAL DATA:  Check enteric catheter placement EXAM: ABDOMEN - 1 VIEW COMPARISON:  None. FINDINGS: Scattered large and small bowel gas is noted. Multiple loops of dilated small bowel seen. Nasogastric catheter is noted within the stomach. The proximal side port lies at the gastroesophageal junction. IMPRESSION: Changes of small bowel dilatation. Nasogastric catheter within the stomach. Electronically Signed   By: Inez Catalina M.D.   On: 02/28/2017 10:59   Ct Abdomen Pelvis W Contrast  Result Date: 02/28/2017 CLINICAL DATA:  Abdominal pain and distention. Nausea vomiting  diarrhea. No prior abdominal surgery. EXAM: CT ABDOMEN AND PELVIS WITH CONTRAST TECHNIQUE: Multidetector CT imaging of the abdomen and pelvis was performed using the standard protocol following bolus administration of intravenous contrast. CONTRAST:  100 mL Isovue 370 IV COMPARISON:  CT pelvis 04/14/2014 FINDINGS: Lower chest: Mild atelectasis in the right posterior lung base. Left lung clear Hepatobiliary: Normal liver.  Gallbladder and bile ducts normal Pancreas: Negative Spleen: Negative Adrenals/Urinary Tract: Normal kidneys. No renal mass or obstruction or stone. Normal urinary bladder. Stomach/Bowel: Pedunculated soft tissue mass in the anterior wall of the gastric antrum measures 27 x 29 mm. Probable neoplasm and biopsy recommended. Dilated small bowel loops with multiple air-fluid levels compatible small bowel obstruction. Transition point in the mid ileum. There is mild thickening of the ileum distal to the obstruction. Colon is decompressed. Normal appendix. Vascular/Lymphatic: Minimal atherosclerotic disease in the aorta. No lymphadenopathy. Reproductive: Mild prostate enlargement Other: Small amount of free fluid in the pelvis. Small amount of free fluid around the spleen. No intra-abdominal abscess Musculoskeletal: Moderate disc degeneration and facet degeneration L5-S1. No acute skeletal abnormality. IMPRESSION: Pedunculated gastric mass compatible with neoplasm. Endoscopy and biopsy recommended. Small bowel obstruction. This appears to be at the level of the mid ileum. There is thickening of the ileum. This could represent inflammatory bowel disease however the thickening is relatively mild. Small amount of free intraperitoneal fluid. Negative for abdominal abscess. These results were called by telephone at the time of interpretation on 02/28/2017 at 9:03 am to Dr. Vallery Ridge , who verbally acknowledged these results. Electronically Signed   By: Franchot Gallo M.D.   On: 02/28/2017 09:04   Scheduled  Meds: . pantoprazole (PROTONIX) IV  40 mg Intravenous Daily   Continuous Infusions: . sodium chloride 1,000 mL (03/01/17 1045)    LOS: 1 day   Time spent: 20 minutes   Faye Ramsay, MD [facility] Pager [phone]  If 7PM-7AM, please contact night-coverage [email or web address] Password [email or web address] 03/01/2017, 4:21 PM

## 2017-03-01 NOTE — Progress Notes (Signed)
Pt reported that after his last dose of Dilaudid he started itching.  Notified provider on call, obtained one time order for Benadryl.

## 2017-03-01 NOTE — Progress Notes (Signed)
Gastrografin administered via NGT at 0943,also called to Radiology.

## 2017-03-02 ENCOUNTER — Encounter (HOSPITAL_COMMUNITY)
Admission: EM | Disposition: A | Discharge status: Home / Self Care | Payer: Self-pay | Source: Home / Self Care | Attending: Internal Medicine

## 2017-03-02 ENCOUNTER — Encounter (HOSPITAL_COMMUNITY): Payer: Self-pay | Admitting: *Deleted

## 2017-03-02 DIAGNOSIS — K2210 Ulcer of esophagus without bleeding: Secondary | ICD-10-CM

## 2017-03-02 DIAGNOSIS — K3189 Other diseases of stomach and duodenum: Secondary | ICD-10-CM

## 2017-03-02 DIAGNOSIS — D490 Neoplasm of unspecified behavior of digestive system: Secondary | ICD-10-CM

## 2017-03-02 DIAGNOSIS — K2970 Gastritis, unspecified, without bleeding: Secondary | ICD-10-CM

## 2017-03-02 HISTORY — PX: ESOPHAGOGASTRODUODENOSCOPY (EGD) WITH PROPOFOL: SHX5813

## 2017-03-02 LAB — BASIC METABOLIC PANEL
Anion gap: 7 (ref 5–15)
BUN: 10 mg/dL (ref 6–20)
CALCIUM: 7.8 mg/dL — AB (ref 8.9–10.3)
CO2: 25 mmol/L (ref 22–32)
CREATININE: 1.02 mg/dL (ref 0.61–1.24)
Chloride: 108 mmol/L (ref 101–111)
GFR calc non Af Amer: >60 mL/min (ref >60)
Glucose, Bld: 82 mg/dL (ref 65–99)
Potassium: 3.4 mmol/L — ABNORMAL LOW (ref 3.5–5.1)
SODIUM: 140 mmol/L (ref 135–145)

## 2017-03-02 LAB — CBC
HCT: 42.3 % (ref 39.0–52.0)
Hemoglobin: 14.1 g/dL (ref 13.0–17.0)
MCH: 32.1 pg (ref 26.0–34.0)
MCHC: 33.3 g/dL (ref 30.0–36.0)
MCV: 96.4 fL (ref 78.0–100.0)
Platelets: 185 10*3/uL (ref 150–400)
RBC: 4.39 MIL/uL (ref 4.22–5.81)
RDW: 13.7 % (ref 11.5–15.5)
WBC: 6.3 10*3/uL (ref 4.0–10.5)

## 2017-03-02 LAB — GASTROINTESTINAL PANEL BY PCR, STOOL (REPLACES STOOL CULTURE)
ASTROVIRUS: NOT DETECTED
Adenovirus F40/41: NOT DETECTED
CAMPYLOBACTER SPECIES: NOT DETECTED
CRYPTOSPORIDIUM: NOT DETECTED
CYCLOSPORA CAYETANENSIS: NOT DETECTED
ENTEROTOXIGENIC E COLI (ETEC): NOT DETECTED
Entamoeba histolytica: NOT DETECTED
Enteroaggregative E coli (EAEC): NOT DETECTED
Enteropathogenic E coli (EPEC): NOT DETECTED
Giardia lamblia: NOT DETECTED
Norovirus GI/GII: NOT DETECTED
PLESIMONAS SHIGELLOIDES: NOT DETECTED
Rotavirus A: NOT DETECTED
SHIGA LIKE TOXIN PRODUCING E COLI (STEC): NOT DETECTED
SHIGELLA/ENTEROINVASIVE E COLI (EIEC): NOT DETECTED
Salmonella species: NOT DETECTED
Sapovirus (I, II, IV, and V): DETECTED — AB
VIBRIO SPECIES: NOT DETECTED
Vibrio cholerae: NOT DETECTED
YERSINIA ENTEROCOLITICA: NOT DETECTED

## 2017-03-02 SURGERY — ESOPHAGOGASTRODUODENOSCOPY (EGD) WITH PROPOFOL
Anesthesia: Moderate Sedation

## 2017-03-02 MED ORDER — FENTANYL CITRATE (PF) 100 MCG/2ML IJ SOLN
INTRAMUSCULAR | Status: AC
  Filled 2017-03-02: qty 2

## 2017-03-02 MED ORDER — MIDAZOLAM HCL 10 MG/2ML IJ SOLN
INTRAMUSCULAR | Status: DC | PRN
  Administered 2017-03-02: 2 mg via INTRAVENOUS
  Administered 2017-03-02: 1 mg via INTRAVENOUS
  Administered 2017-03-02 (×2): 2 mg via INTRAVENOUS

## 2017-03-02 MED ORDER — PANTOPRAZOLE SODIUM 40 MG PO TBEC
40.0000 mg | DELAYED_RELEASE_TABLET | Freq: Two times a day (BID) | ORAL | Status: DC
  Administered 2017-03-02: 40 mg via ORAL
  Filled 2017-03-02: qty 1

## 2017-03-02 MED ORDER — ONDANSETRON HCL 4 MG PO TABS: 4.0000 mg | ORAL_TABLET | Freq: Four times a day (QID) | ORAL | 0 refills | Status: DC | PRN

## 2017-03-02 MED ORDER — FENTANYL CITRATE (PF) 100 MCG/2ML IJ SOLN
INTRAMUSCULAR | Status: DC | PRN
  Administered 2017-03-02 (×2): 25 ug via INTRAVENOUS

## 2017-03-02 MED ORDER — DIPHENHYDRAMINE HCL 50 MG/ML IJ SOLN
INTRAMUSCULAR | Status: AC
  Filled 2017-03-02: qty 1

## 2017-03-02 MED ORDER — DIPHENHYDRAMINE HCL 50 MG/ML IJ SOLN
INTRAMUSCULAR | Status: DC | PRN
  Administered 2017-03-02: 25 mg via INTRAVENOUS

## 2017-03-02 MED ORDER — BUTAMBEN-TETRACAINE-BENZOCAINE 2-2-14 % EX AERO
INHALATION_SPRAY | CUTANEOUS | Status: DC | PRN
  Administered 2017-03-02: 2 via TOPICAL

## 2017-03-02 MED ORDER — SPOT INK MARKER SYRINGE KIT
PACK | SUBMUCOSAL | Status: AC
Start: 2017-03-02 — End: 2017-03-02
  Filled 2017-03-02: qty 5

## 2017-03-02 MED ORDER — HYDRALAZINE HCL 20 MG/ML IJ SOLN
INTRAMUSCULAR | Status: AC
  Filled 2017-03-02: qty 1

## 2017-03-02 MED ORDER — MIDAZOLAM HCL 5 MG/ML IJ SOLN
INTRAMUSCULAR | Status: AC
  Filled 2017-03-02: qty 2

## 2017-03-02 MED ORDER — TRAMADOL HCL 50 MG PO TABS: 50.0000 mg | ORAL_TABLET | Freq: Four times a day (QID) | ORAL | 0 refills | Status: DC | PRN

## 2017-03-02 MED ORDER — SPOT INK MARKER SYRINGE KIT
PACK | SUBMUCOSAL | Status: DC | PRN
  Administered 2017-03-02: 3 mL via SUBMUCOSAL

## 2017-03-02 MED ORDER — POTASSIUM CHLORIDE CRYS ER 20 MEQ PO TBCR
40.0000 meq | EXTENDED_RELEASE_TABLET | Freq: Once | ORAL | Status: AC
  Administered 2017-03-02: 40 meq via ORAL
  Filled 2017-03-02: qty 2

## 2017-03-02 MED ORDER — PANTOPRAZOLE SODIUM 40 MG PO TBEC: 40.0000 mg | DELAYED_RELEASE_TABLET | Freq: Two times a day (BID) | ORAL | 1 refills | Status: AC

## 2017-03-02 MED ORDER — ACETAMINOPHEN 325 MG PO TABS: 650.0000 mg | ORAL_TABLET | ORAL | 2 refills | Status: AC | PRN

## 2017-03-02 SURGICAL SUPPLY — 14 items

## 2017-03-02 NOTE — Discharge Summary (Signed)
Physician Discharge Summary  Jacere Pangborn LNL:[id number] DOB: 1955/12/29 DOA: 02/28/2017  PCP: Velna Hatchet, MD  Admit date: 02/28/2017 Discharge date: 03/02/2017  Recommendations for Outpatient Follow-up:  1. Pt will need to follow up with PCP in 1-2 weeks post discharge 2. Please obtain BMP to evaluate electrolytes and kidney function 3. Please also check CBC to evaluate Hg and Hct levels 4. Please note that EGD done during the hospital stay, pathology results pending on discharge   Discharge Diagnoses:  Principal Problem:   HTN (hypertension)   AKI (acute kidney injury) ([place])   Abnormal CT of the abdomen  Discharge Condition: Stable  Diet recommendation: Heart healthy diet discussed in details   Brief Narrative:  Pt is very pleasant 61 yo male, employer of [employer], known hx of HTN, presented to ED with main concern of abd pain, diarrhea, nausea and non bloody vomiting. In ED, CT abd notable for soft tissue mass in the ant wall of the gastric antrum, dilated small bowel loops, ? Gastroenteritis. GI and surgery team consulted and [facility] asked to admit pt.   Assessment & Plan:   Principal Problem:   Acute epigastric pain, N/V/D - ? Acute ileitis, ? Enteritis, stool panel + for sapovirus  - EGD done and was notable for diffuse gastritis, H. Pylori pending on discharge and needs to be followed up  - polypoid mass also biopsied  - pt now tolerating diet well - per GI started PPI BID - pt wants to go home, GI team has cleared for discharge    Active Problems:   HTN (hypertension), essential - reasonably stable     Hypokalemia - supplemented     AKI (acute kidney injury) ([place]) - resolved with IVF  DVT prophylaxis: SCD's Code Status: Full  Family Communication: Patient at bedside  Disposition Plan: home   Consultants:   GI  Surgery   Procedures:   EGD 5/26 -->  Procedures/Studies: Dg Abdomen 1 View  Result Date: 02/28/2017 CLINICAL DATA:  Check enteric  catheter placement EXAM: ABDOMEN - 1 VIEW COMPARISON:  None. FINDINGS: Scattered large and small bowel gas is noted. Multiple loops of dilated small bowel seen. Nasogastric catheter is noted within the stomach. The proximal side port lies at the gastroesophageal junction. IMPRESSION: Changes of small bowel dilatation. Nasogastric catheter within the stomach. Electronically Signed   By: Inez Catalina M.D.   On: 02/28/2017 10:59   Ct Abdomen Pelvis W Contrast  Result Date: 02/28/2017 CLINICAL DATA:  Abdominal pain and distention. Nausea vomiting diarrhea. No prior abdominal surgery. EXAM: CT ABDOMEN AND PELVIS WITH CONTRAST TECHNIQUE: Multidetector CT imaging of the abdomen and pelvis was performed using the standard protocol following bolus administration of intravenous contrast. CONTRAST:  100 mL Isovue 370 IV COMPARISON:  CT pelvis 04/14/2014 FINDINGS: Lower chest: Mild atelectasis in the right posterior lung base. Left lung clear Hepatobiliary: Normal liver.  Gallbladder and bile ducts normal Pancreas: Negative Spleen: Negative Adrenals/Urinary Tract: Normal kidneys. No renal mass or obstruction or stone. Normal urinary bladder. Stomach/Bowel: Pedunculated soft tissue mass in the anterior wall of the gastric antrum measures 27 x 29 mm. Probable neoplasm and biopsy recommended. Dilated small bowel loops with multiple air-fluid levels compatible small bowel obstruction. Transition point in the mid ileum. There is mild thickening of the ileum distal to the obstruction. Colon is decompressed. Normal appendix. Vascular/Lymphatic: Minimal atherosclerotic disease in the aorta. No lymphadenopathy. Reproductive: Mild prostate enlargement Other: Small amount of free fluid in the pelvis. Small amount  of free fluid around the spleen. No intra-abdominal abscess Musculoskeletal: Moderate disc degeneration and facet degeneration L5-S1. No acute skeletal abnormality. IMPRESSION: Pedunculated gastric mass compatible with  neoplasm. Endoscopy and biopsy recommended. Small bowel obstruction. This appears to be at the level of the mid ileum. There is thickening of the ileum. This could represent inflammatory bowel disease however the thickening is relatively mild. Small amount of free intraperitoneal fluid. Negative for abdominal abscess. These results were called by telephone at the time of interpretation on 02/28/2017 at 9:03 am to Dr. Vallery Ridge , who verbally acknowledged these results. Electronically Signed   By: Franchot Gallo M.D.   On: 02/28/2017 09:04   Dg Abd Portable 1v-small Bowel Obstruction Protocol-initial, 8 Hr Delay  Result Date: 03/01/2017 CLINICAL DATA:  Small-bowel obstruction protocol. 8 hour delayed film. EXAM: PORTABLE ABDOMEN - 1 VIEW COMPARISON:  Abdominal radiograph performed 02/28/2017 FINDINGS: Contrast is seen filling the colon. There is no evidence for bowel obstruction. Underlying distended small bowel loops likely reflect some degree of small bowel dysmotility. The patient's enteric tube is noted ending overlying the fundus of the stomach, with the side port at the distal esophagus. No acute osseous abnormalities are seen. No free intra-abdominal air is identified, though evaluation for free air is limited on a single supine view. IMPRESSION: 1. Contrast seen filling the colon. No evidence for bowel obstruction. Underlying distended small bowel loops likely reflect some degree of small bowel dysmotility. 2. Enteric tube noted ending overlying the fundus of the stomach, with the side port at the distal esophagus. Electronically Signed   By: Garald Balding M.D.   On: 03/01/2017 18:45     Discharge Exam: Vitals:   03/02/17 1107 03/02/17 1259  BP: (!) 132/97 (!) 157/104  Pulse:  73  Resp:  18  Temp:  98.6 F (37 C)   Vitals:   03/02/17 0925 03/02/17 1015 03/02/17 1107 03/02/17 1259  BP: 112/67 (!) 152/89 (!) 132/97 (!) 157/104  Pulse: 79 75  73  Resp: 18 18  18   Temp:  98.7 F (37.1 C)   98.6 F (37 C)  TempSrc:  Oral  Oral  SpO2: 97% 96%  98%  Weight:      Height:        General: Pt is alert, follows commands appropriately, not in acute distress Cardiovascular: Regular rate and rhythm, S1/S2 +, no murmurs, no rubs, no gallops Respiratory: Clear to auscultation bilaterally, no wheezing, no crackles, no rhonchi Abdominal: Soft, non tender, non distended, bowel sounds +, no guarding Extremities: no edema, no cyanosis, pulses palpable bilaterally DP and PT Neuro: Grossly nonfocal  Discharge Instructions  Discharge Instructions    Diet - low sodium heart healthy    Complete by:  As directed    Increase activity slowly    Complete by:  As directed      Allergies as of 03/02/2017      Reactions   Penicillins    Has patient had a PCN reaction causing immediate rash, facial/tongue/throat swelling, SOB or lightheadedness with hypotension: unknown Has patient had a PCN reaction causing severe rash involving mucus membranes or skin necrosis: Unknown Has patient had a PCN reaction that required hospitalization: Yes Has patient had a PCN reaction occurring within the last 10 years: No If all of the above answers are "NO", then may proceed with Cephalosporin use. "childhood reaction"      Medication List    TAKE these medications   acetaminophen 325 MG tablet Commonly  known as:  TYLENOL Take 2 tablets (650 mg total) by mouth every 4 (four) hours as needed.   lisinopril 10 MG tablet Commonly known as:  PRINIVIL,ZESTRIL Take 10 mg by mouth daily.   ondansetron 4 MG tablet Commonly known as:  ZOFRAN Take 1 tablet (4 mg total) by mouth every 6 (six) hours as needed for nausea.   pantoprazole 40 MG tablet Commonly known as:  PROTONIX Take 1 tablet (40 mg total) by mouth 2 (two) times daily.   traMADol 50 MG tablet Commonly known as:  ULTRAM Take 1 tablet (50 mg total) by mouth every 6 (six) hours as needed.       Follow-up Information    Velna Hatchet, MD  Follow up.   Specialty:  Internal Medicine Contact information: [place] [address] [phone]        Theodis Blaze, MD Follow up.   Specialty:  Internal Medicine Why:  call my cell phone [phone] with questions please Contact information: [address] [place] [address] [phone]        Theodis Blaze, MD .   Specialty:  Internal Medicine Contact information: [address] [place] [address] [phone]            The results of significant diagnostics from this hospitalization (including imaging, microbiology, ancillary and laboratory) are listed below for reference.     Microbiology: Recent Results (from the past 240 hour(s))  Gastrointestinal Panel by PCR , Stool     Status: Abnormal   Collection Time: 02/28/17 12:41 PM  Result Value Ref Range Status   Campylobacter species NOT DETECTED NOT DETECTED Final   Plesimonas shigelloides NOT DETECTED NOT DETECTED Final   Salmonella species NOT DETECTED NOT DETECTED Final   Yersinia enterocolitica NOT DETECTED NOT DETECTED Final   Vibrio species NOT DETECTED NOT DETECTED Final   Vibrio cholerae NOT DETECTED NOT DETECTED Final   Enteroaggregative E coli (EAEC) NOT DETECTED NOT DETECTED Final   Enteropathogenic E coli (EPEC) NOT DETECTED NOT DETECTED Final   Enterotoxigenic E coli (ETEC) NOT DETECTED NOT DETECTED Final   Shiga like toxin producing E coli (STEC) NOT DETECTED NOT DETECTED Final   Shigella/Enteroinvasive E coli (EIEC) NOT DETECTED NOT DETECTED Final   Cryptosporidium NOT DETECTED NOT DETECTED Final   Cyclospora cayetanensis NOT DETECTED NOT DETECTED Final   Entamoeba histolytica NOT DETECTED NOT DETECTED Final   Giardia lamblia NOT DETECTED NOT DETECTED Final   Adenovirus F40/41 NOT DETECTED NOT DETECTED Final   Astrovirus NOT DETECTED NOT DETECTED Final   Norovirus GI/GII NOT DETECTED NOT DETECTED Final   Rotavirus A NOT DETECTED NOT  DETECTED Final   Sapovirus (I, II, IV, and V) DETECTED (A) NOT DETECTED Final     Labs: Basic Metabolic Panel:  Recent Labs Lab 02/28/17 0629 03/01/17 0356 03/02/17 0516  NA 136 140 140  K 3.5 3.3* 3.4*  CL 103 109 108  CO2 24 24 25   GLUCOSE 128* 106* 82  BUN 15 13 10   CREATININE 1.26* 1.02 1.02  CALCIUM 8.5* 7.7* 7.8*   Liver Function Tests:  Recent Labs Lab 02/28/17 0629  AST 33  ALT 36  ALKPHOS 71  BILITOT 0.9  PROT 6.9  ALBUMIN 3.6    Recent Labs Lab 02/28/17 0629  LIPASE 26   CBC:  Recent Labs Lab 02/28/17 0629 03/01/17 0356 03/02/17 0516  WBC 5.7 5.9 6.3  NEUTROABS 3.6  --   --  HGB 17.9* 14.4 14.1  HCT 50.6 42.2 42.3  MCV 94.4 95.7 96.4  PLT 177 169 185   SIGNED: Time coordinating discharge: 30 minutes  Faye Ramsay, MD  [facility] 03/02/2017, 2:45 PM Pager [phone]  If 7PM-7AM, please contact night-coverage [email or web address] Password [email or web address]

## 2017-03-02 NOTE — Op Note (Signed)
[facility] Patient Name: Nicholas [name] Procedure Date: 03/02/2017 MRN: [id number] Attending MD: Carlota Raspberry. Kacy Hegna MD, MD Date of Birth: [birth year] CSN: [id number] Age: 61 Admit Type: Inpatient Procedure:                Upper GI endoscopy Indications:              Abnormal CT of the GI tract - gastric mass versus                            polyp in antrum Providers:                Carlota Raspberry. Cristhian Vanhook MD, MD, Kingsley Plan, RN,                            Danford Bad, Technician Referring MD:              Medicines:                Fentanyl 50 micrograms IV, Midazolam 7 mg IV,                            Diphenhydramine 25 mg IV Complications:            No immediate complications. Estimated blood loss:                            Minimal. Estimated Blood Loss:     Estimated blood loss was minimal. Procedure:                Pre-Anesthesia Assessment:                           - Prior to the procedure, a History and Physical                            was performed, and patient medications and                            allergies were reviewed. The patient's tolerance of                            previous anesthesia was also reviewed. The risks                            and benefits of the procedure and the sedation                            options and risks were discussed with the patient.                            All questions were answered, and informed consent                            was obtained. Prior Anticoagulants: The patient has  taken no previous anticoagulant or antiplatelet                            agents. ASA Grade Assessment: II - A patient with                            mild systemic disease. After reviewing the risks                            and benefits, the patient was deemed in                            satisfactory condition to undergo the procedure.                           After obtaining informed  consent, the endoscope was                            passed under direct vision. Throughout the                            procedure, the patient's blood pressure, pulse, and                            oxygen saturations were monitored continuously. The                            Endoscope was introduced through the mouth, and                            advanced to the second part of duodenum. The upper                            GI endoscopy was accomplished without difficulty.                            The patient tolerated the procedure well. Scope In: Scope Out: Findings:      Esophagogastric landmarks were identified: the Z-line was found at 41       cm, the gastroesophageal junction was found at 41 cm and the upper       extent of the gastric folds was found at 41 cm from the incisors.      One superficial esophageal ulceration with no bleeding and no stigmata       of recent bleeding was found at the GEJ, suspect related to recent NG       tube placement.      The exam of the esophagus was otherwise normal.      Diffuse moderate inflammation characterized by erythema, friability and       granularity was found in the entire examined stomach. Biopsies were       taken with a cold forceps from the antrum and body for Helicobacter       pylori testing.      A polypoid mass with no bleeding and no stigmata of recent bleeding was  found on the greater curvature of the proximal gastric antrum, roughly       3cm in size or so. It had a broad base to it, endoscopic removal was not       attempted during this exam. Biopsies were taken with a cold forceps for       histology to rule out malignancy. An area a few cm proximal to the       lesion was tattooed with an injection of Spot (carbon black) (in case       endoscopic removal is possible, the lesion itself was not tattooed).      The exam of the stomach was otherwise normal.      The duodenal bulb and second portion of the  duodenum were normal. Impression:               - Esophagogastric landmarks identified.                           - Non-bleeding esophageal ulcer at the GEJ suspect                            related to recent NG tube placement.                           - Diffuse gastritis. Biopsied to rule out H pylori.                           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 Biopsied. Tattoo place                            proximal to the lesion.                           - Normal duodenal bulb and second portion of the                            duodenum.                           Overall, differential for this lesion includes                            large adenomatous / hyperplastic polyp versus                            adenocarcinoma versus GIST, etc. Will await                            biopsies prior to further recommendations regarding                            removal Moderate Sedation:      Moderate (conscious) sedation was administered by the endoscopy nurse       and  supervised by the endoscopist. The following parameters were       monitored: oxygen saturation, heart rate, blood pressure, and response       to care. Total physician intraservice time was 30 minutes. Recommendation:           - Return patient to hospital ward for ongoing care.                           - Obstructive symptoms have resolved, clear liquid                            diet, advance as tolerated later today                           - Continue present medications.                           - Increase protonix to 40mg  twice daily                           - Await pathology results with further                            recommendations Procedure Code(s):        --- Professional ---                           [phone], Esophagogastroduodenoscopy, flexible,                            transoral; with biopsy, single or multiple                            43236, 61, Esophagogastroduodenoscopy, flexible,                            transoral; with directed submucosal injection(s),                            any substance                           99152, Moderate sedation services provided by the                            same physician or other qualified health care                            professional performing the diagnostic or                            therapeutic service that the sedation supports,                            requiring the presence of an independent trained  observer to assist in the monitoring of the                            patient's level of consciousness and physiological                            status; initial 15 minutes of intraservice time,                            patient age 20 years or older                           [phone], Moderate sedation services; each additional                            15 minutes intraservice time Diagnosis Code(s):        --- Professional ---                           K22.10, Ulcer of esophagus without bleeding                           K29.70, Gastritis, unspecified, without bleeding                           D49.0, Neoplasm of unspecified behavior of                            digestive system                           R93.3, Abnormal findings on diagnostic imaging of                            other parts of digestive tract CPT copyright 2016 [employer]. All rights reserved. The codes documented in this report are preliminary and upon coder review may  be revised to meet current compliance requirements. Remo Lipps P. Iran Kievit MD, MD 03/02/2017 9:18:24 AM This report has been signed electronically. Number of Addenda: 0

## 2017-03-02 NOTE — Progress Notes (Addendum)
Assessment unchanged. Pt verbalized understanding of dc instructions through teach back including MD follow up care and when to call the doctor. No scripts at dc. Dr. Doyle Askew called medications into pt's personal pharmacy. Discharge via wc to front entrance to meet awaiting vehicle to carry home. Accompanied by NT.

## 2017-03-02 NOTE — Interval H&P Note (Signed)
History and Physical Interval Note:  03/02/2017 8:35 AM  Nicholas [name]  has presented today for surgery, with the diagnosis of gastric mass  The various methods of treatment have been discussed with the patient and family. After consideration of risks, benefits and other options for treatment, the patient has consented to  Procedure(s): ESOPHAGOGASTRODUODENOSCOPY (EGD) (N/A) as a surgical intervention .  The patient's history has been reviewed, patient examined, no change in status, [name] for surgery.  I have reviewed the patient's chart and labs.  Questions were answered to the patient's satisfaction.     Renelda Loma Armbruster

## 2017-03-02 NOTE — Discharge Instructions (Signed)

## 2017-03-02 NOTE — Progress Notes (Signed)
Day of Surgery   Subjective/Chief Complaint: Feels much better, had large bm and passing flatus, abdomen feels back to normal   Objective: Vital signs in last 24 hours: Temp:  [98.2 F (36.8 C)-99.1 F (37.3 C)] 98.7 F (37.1 C) (05/26 0646) Pulse Rate:  [63-86] 86 (05/26 0646) Resp:  [18] 18 (05/26 0646) BP: (141-155)/(91-98) 155/91 (05/26 0646) SpO2:  [97 %-98 %] 97 % (05/26 0646) Weight:  [110.2 kg (243 lb)] 110.2 kg (243 lb) (05/25 1717) Last BM Date: 03/01/17  Intake/Output from previous day: 05/25 0701 - 05/26 0700 In: 3866.3 [P.O.:660; I.V.:3006.3; IV Piggyback:200] Out: 2550 [Urine:850; Emesis/NG output:1200; Stool:500] Intake/Output this shift: No intake/output data recorded.  General appearance: no distress Resp: clear to auscultation bilaterally Cardio: regular rate and rhythm GI: soft nt/nd bs present  Lab Results:   Recent Labs  03/01/17 0356 03/02/17 0516  WBC 5.9 6.3  HGB 14.4 14.1  HCT 42.2 42.3  PLT 169 185   BMET  Recent Labs  03/01/17 0356 03/02/17 0516  NA 140 140  K 3.3* 3.4*  CL 109 108  CO2 24 25  GLUCOSE 106* 82  BUN 13 10  CREATININE 1.02 1.02  CALCIUM 7.7* 7.8*   PT/INR No results for input(s): LABPROT, INR in the last 72 hours. ABG No results for input(s): PHART, HCO3 in the last 72 hours.  Invalid input(s): PCO2, PO2  Studies/Results: Dg Abdomen 1 View  Result Date: 02/28/2017 CLINICAL DATA:  Check enteric catheter placement EXAM: ABDOMEN - 1 VIEW COMPARISON:  None. FINDINGS: Scattered large and small bowel gas is noted. Multiple loops of dilated small bowel seen. Nasogastric catheter is noted within the stomach. The proximal side port lies at the gastroesophageal junction. IMPRESSION: Changes of small bowel dilatation. Nasogastric catheter within the stomach. Electronically Signed   By: Inez Catalina M.D.   On: 02/28/2017 10:59   Ct Abdomen Pelvis W Contrast  Result Date: 02/28/2017 CLINICAL DATA:  Abdominal pain and  distention. Nausea vomiting diarrhea. No prior abdominal surgery. EXAM: CT ABDOMEN AND PELVIS WITH CONTRAST TECHNIQUE: Multidetector CT imaging of the abdomen and pelvis was performed using the standard protocol following bolus administration of intravenous contrast. CONTRAST:  100 mL Isovue 370 IV COMPARISON:  CT pelvis 04/14/2014 FINDINGS: Lower chest: Mild atelectasis in the right posterior lung base. Left lung clear Hepatobiliary: Normal liver.  Gallbladder and bile ducts normal Pancreas: Negative Spleen: Negative Adrenals/Urinary Tract: Normal kidneys. No renal mass or obstruction or stone. Normal urinary bladder. Stomach/Bowel: Pedunculated soft tissue mass in the anterior wall of the gastric antrum measures 27 x 29 mm. Probable neoplasm and biopsy recommended. Dilated small bowel loops with multiple air-fluid levels compatible small bowel obstruction. Transition point in the mid ileum. There is mild thickening of the ileum distal to the obstruction. Colon is decompressed. Normal appendix. Vascular/Lymphatic: Minimal atherosclerotic disease in the aorta. No lymphadenopathy. Reproductive: Mild prostate enlargement Other: Small amount of free fluid in the pelvis. Small amount of free fluid around the spleen. No intra-abdominal abscess Musculoskeletal: Moderate disc degeneration and facet degeneration L5-S1. No acute skeletal abnormality. IMPRESSION: Pedunculated gastric mass compatible with neoplasm. Endoscopy and biopsy recommended. Small bowel obstruction. This appears to be at the level of the mid ileum. There is thickening of the ileum. This could represent inflammatory bowel disease however the thickening is relatively mild. Small amount of free intraperitoneal fluid. Negative for abdominal abscess. These results were called by telephone at the time of interpretation on 02/28/2017 at 9:03 am to  Dr. Vallery Ridge , who verbally acknowledged these results. Electronically Signed   By: Franchot Gallo M.D.   On:  02/28/2017 09:04   Dg Abd Portable 1v-small Bowel Obstruction Protocol-initial, 8 Hr Delay  Result Date: 03/01/2017 CLINICAL DATA:  Small-bowel obstruction protocol. 8 hour delayed film. EXAM: PORTABLE ABDOMEN - 1 VIEW COMPARISON:  Abdominal radiograph performed 02/28/2017 FINDINGS: Contrast is seen filling the colon. There is no evidence for bowel obstruction. Underlying distended small bowel loops likely reflect some degree of small bowel dysmotility. The patient's enteric tube is noted ending overlying the fundus of the stomach, with the side port at the distal esophagus. No acute osseous abnormalities are seen. No free intra-abdominal air is identified, though evaluation for free air is limited on a single supine view. IMPRESSION: 1. Contrast seen filling the colon. No evidence for bowel obstruction. Underlying distended small bowel loops likely reflect some degree of small bowel dysmotility. 2. Enteric tube noted ending overlying the fundus of the stomach, with the side port at the distal esophagus. Electronically Signed   By: Garald Balding M.D.   On: 03/01/2017 18:45    Anti-infectives: Anti-infectives    None      Assessment/Plan: Gastroenteritis/ileusvs SBO -not sure ever was obstructed, contrast in colon and having bowel function -can have ng out today (wishes to have out in endoscopy which is fine), start clears this am, advance to fulls, if tolerates fulls he can be discharged home today on fulls and advance diet at home pending results of egd  Gastric mass - EGD today evaluate     [facility] 03/02/2017

## 2017-03-02 NOTE — H&P (View-Only) (Signed)
Progress Note   Subjective  Patient feels improved this morning, less abdominal distension, but not yet back to his "normal" state. He is passing gas. NG is uncomfortable, about 500cc out. Otherwise no acute events overnight.   Objective   Vital signs in last 24 hours: Temp:  [97.7 F (36.5 C)-98.6 F (37 C)] 98 F (36.7 C) (05/25 0549) Pulse Rate:  [59-95] 63 (05/25 0549) Resp:  [17-18] 18 (05/25 0549) BP: (110-149)/(66-100) 134/80 (05/25 0549) SpO2:  [94 %-98 %] 94 % (05/25 0549) Last BM Date: 02/28/17 General:    white male in NAD Heart:  Regular rate and rhythm; no murmurs Lungs: Respirations even and unlabored, lungs CTA bilaterally Abdomen:  Softer than prior exam, nontender, mildly distended.  Extremities:  Without edema. Neurologic:  Alert and oriented,  grossly normal neurologically. Psych:  Cooperative. Normal mood and affect.  Intake/Output from previous day: 05/24 0701 - 05/25 0700 In: 3289.6 [I.V.:1289.6; IV Piggyback:2000] Out: 900 [Urine:400; Emesis/NG output:500] Intake/Output this shift: No intake/output data recorded.  Lab Results:  Recent Labs  02/28/17 0629 03/01/17 0356  WBC 5.7 5.9  HGB 17.9* 14.4  HCT 50.6 42.2  PLT 177 169   BMET  Recent Labs  02/28/17 0629 03/01/17 0356  NA 136 140  K 3.5 3.3*  CL 103 109  CO2 24 24  GLUCOSE 128* 106*  BUN 15 13  CREATININE 1.26* 1.02  CALCIUM 8.5* 7.7*   LFT  Recent Labs  02/28/17 0629  PROT 6.9  ALBUMIN 3.6  AST 33  ALT 36  ALKPHOS 71  BILITOT 0.9   PT/INR No results for input(s): LABPROT, INR in the last 72 hours.  Studies/Results: Dg Abdomen 1 View  Result Date: 02/28/2017 CLINICAL DATA:  Check enteric catheter placement EXAM: ABDOMEN - 1 VIEW COMPARISON:  None. FINDINGS: Scattered large and small bowel gas is noted. Multiple loops of dilated small bowel seen. Nasogastric catheter is noted within the stomach. The proximal side port lies at the gastroesophageal junction.  IMPRESSION: Changes of small bowel dilatation. Nasogastric catheter within the stomach. Electronically Signed   By: Inez Catalina M.D.   On: 02/28/2017 10:59   Ct Abdomen Pelvis W Contrast  Result Date: 02/28/2017 CLINICAL DATA:  Abdominal pain and distention. Nausea vomiting diarrhea. No prior abdominal surgery. EXAM: CT ABDOMEN AND PELVIS WITH CONTRAST TECHNIQUE: Multidetector CT imaging of the abdomen and pelvis was performed using the standard protocol following bolus administration of intravenous contrast. CONTRAST:  100 mL Isovue 370 IV COMPARISON:  CT pelvis 04/14/2014 FINDINGS: Lower chest: Mild atelectasis in the right posterior lung base. Left lung clear Hepatobiliary: Normal liver.  Gallbladder and bile ducts normal Pancreas: Negative Spleen: Negative Adrenals/Urinary Tract: Normal kidneys. No renal mass or obstruction or stone. Normal urinary bladder. Stomach/Bowel: Pedunculated soft tissue mass in the anterior wall of the gastric antrum measures 27 x 29 mm. Probable neoplasm and biopsy recommended. Dilated small bowel loops with multiple air-fluid levels compatible small bowel obstruction. Transition point in the mid ileum. There is mild thickening of the ileum distal to the obstruction. Colon is decompressed. Normal appendix. Vascular/Lymphatic: Minimal atherosclerotic disease in the aorta. No lymphadenopathy. Reproductive: Mild prostate enlargement Other: Small amount of free fluid in the pelvis. Small amount of free fluid around the spleen. No intra-abdominal abscess Musculoskeletal: Moderate disc degeneration and facet degeneration L5-S1. No acute skeletal abnormality. IMPRESSION: Pedunculated gastric mass compatible with neoplasm. Endoscopy and biopsy recommended. Small bowel obstruction. This appears to be at  the level of the mid ileum. There is thickening of the ileum. This could represent inflammatory bowel disease however the thickening is relatively mild. Small amount of free  intraperitoneal fluid. Negative for abdominal abscess. These results were called by telephone at the time of interpretation on 02/28/2017 at 9:03 am to Dr. Vallery Ridge , who verbally acknowledged these results. Electronically Signed   By: Franchot Gallo M.D.   On: 02/28/2017 09:04       Assessment / Plan:   61 y/o male presenting with acute ileitis / possible SBO - ddx for ileitis includes infectious versus Crohn's versus less likely malignancy.   Thus far he appears better since admission with NG decompression but not yet back to baseline, mild distension persists. Surgery planning small bowel protocol with gastrograffin. Stool studies pending.   Incidentally noted on CT scan there is a polypoid verse mass-like lesion in the antrum. Hopefully this is a benign polyp which can be endoscopically removed, but malignancy is possible and would recommend endoscopy prior to discharge to expedite his workup. I think a bit more time to allow the small bowel to further decompress would be good prior to EGD, will tentatively schedule him for EGD tomorrow, pending his course today. I have discussed risks / benefits of EGD with him, and he wishes to proceed.   Moving forward, the patient will need colonoscopy versus interval enterography study with CT or MRI to re-evaluate ileal findings in upcoming weeks.   Please call with questions.  [place] Cellar, MD [facility] Pager [phone]

## 2017-03-02 NOTE — Progress Notes (Signed)
Spoke with pt via phone @1647  post discharge regarding BP medication. Last BP elevated this afternoon. Instructed pt to resume home meds including BP med upon arrival home with verbalized understanding.

## 2017-03-02 NOTE — Progress Notes (Addendum)
Patient ID: Nicholas [name], male   DOB: 10/20/1955, 61 y.o.   MRN: [id number]    PROGRESS NOTE    Nicholas [name]  ZWC:[id number] DOB: 06/02/1956 DOA: 02/28/2017  PCP: Velna Hatchet, MD   Brief Narrative:  Pt is very pleasant 61 yo male, employer of [employer], known hx of HTN, presented to ED with main concern of abd pain, diarrhea, nausea and non bloody vomiting. In ED, CT abd notable for soft tissue mass in the ant wall of the gastric antrum, dilated small bowel loops, ? Gastroenteritis. GI and surgery team consulted and [facility] asked to admit pt.   Assessment & Plan:   Principal Problem:   Acute epigastric pain, N/V/D - ? Acute ileitis vs possible SBO - EGD done this Am and notable for diffuse gastritis, sent for H. Pylori - also polypoid mass biopsied and sent for pathology  - further recommendations pending path results  - NGT has been removed, diet advanced, stop IVF - protonix PO BID started  - if pt tolerating well, can be d/c by tomorrow   Active Problems:   HTN (hypertension), essential - SBP in 150's this AM - monitor fornow    Hypokalemia - still low, will supplement - repeat BMP in AM    AKI (acute kidney injury) ([place]) - pre renal in etiology - IVF provided and Cr is WNL - stop IVF and repeat BMP in AM  DVT prophylaxis: SCD's Code Status: Full  Family Communication: Patient at bedside  Disposition Plan: home in AM  Consultants:   GI  Surgery   Procedures:   EGD 5/26 -->  Antimicrobials:   None  Subjective: Pt reports no abd pain, no N/V, no diarrhea.   Objective: Vitals:   03/02/17 0905 03/02/17 0915 03/02/17 0925 03/02/17 1015  BP: 127/75 122/65 112/67 (!) 152/89  Pulse: 81 80 79 75  Resp: 16 18 18 18   Temp: 98.1 F (36.7 C)   98.7 F (37.1 C)  TempSrc: Oral   Oral  SpO2: 99% 98% 97% 96%  Weight:      Height:        Intake/Output Summary (Last 24 hours) at 03/02/17 1056 Last data filed at 03/02/17 0803  Gross per 24 hour  Intake           3866.25 ml  Output             2200 ml  Net          1666.25 ml   Filed Weights   03/01/17 1717  Weight: 110.2 kg (243 lb)    Examination:  Physical Exam  Constitutional: Appears well-developed and well-nourished. No distress.  CVS: RRR, S1/S2 +, no murmurs, no gallops, no carotid bruit.  Pulmonary: Effort and breath sounds normal, no stridor, rhonchi, wheezes, rales.  Abdominal: Soft. BS +,  no distension, tenderness, rebound or guarding.   Data Reviewed: I have personally reviewed following labs and imaging studies  CBC:  Recent Labs Lab 02/28/17 0629 03/01/17 0356 03/02/17 0516  WBC 5.7 5.9 6.3  NEUTROABS 3.6  --   --   HGB 17.9* 14.4 14.1  HCT 50.6 42.2 42.3  MCV 94.4 95.7 96.4  PLT 177 169 235   Basic Metabolic Panel:  Recent Labs Lab 02/28/17 0629 03/01/17 0356 03/02/17 0516  NA 136 140 140  K 3.5 3.3* 3.4*  CL 103 109 108  CO2 24 24 25   GLUCOSE 128* 106* 82  BUN 15 13 10   CREATININE 1.26* 1.02 1.02  CALCIUM 8.5* 7.7* 7.8*   Liver Function Tests:  Recent Labs Lab 02/28/17 0629  AST 33  ALT 36  ALKPHOS 71  BILITOT 0.9  PROT 6.9  ALBUMIN 3.6    Recent Labs Lab 02/28/17 0629  LIPASE 26   Radiology Studies: Dg Abd Portable 1v-small Bowel Obstruction Protocol-initial, 8 Hr Delay  Result Date: 03/01/2017 CLINICAL DATA:  Small-bowel obstruction protocol. 8 hour delayed film. EXAM: PORTABLE ABDOMEN - 1 VIEW COMPARISON:  Abdominal radiograph performed 02/28/2017 FINDINGS: Contrast is seen filling the colon. There is no evidence for bowel obstruction. Underlying distended small bowel loops likely reflect some degree of small bowel dysmotility. The patient's enteric tube is noted ending overlying the fundus of the stomach, with the side port at the distal esophagus. No acute osseous abnormalities are seen. No free intra-abdominal air is identified, though evaluation for free air is limited on a single supine view. IMPRESSION: 1. Contrast seen filling  the colon. No evidence for bowel obstruction. Underlying distended small bowel loops likely reflect some degree of small bowel dysmotility. 2. Enteric tube noted ending overlying the fundus of the stomach, with the side port at the distal esophagus. Electronically Signed   By: Garald Balding M.D.   On: 03/01/2017 18:45   Scheduled Meds: . pantoprazole  40 mg Oral BID   Continuous Infusions: . sodium chloride 125 mL/hr at 03/02/17 0307    LOS: 2 days   Time spent: 20 minutes   Faye Ramsay, MD [facility] Pager [phone]  If 7PM-7AM, please contact night-coverage [email or web address] Password [email or web address] 03/02/2017, 10:56 AM

## 2017-03-06 ENCOUNTER — Other Ambulatory Visit: Payer: Self-pay

## 2017-03-06 ENCOUNTER — Telehealth: Payer: Self-pay

## 2017-03-06 DIAGNOSIS — K3189 Other diseases of stomach and duodenum: Secondary | ICD-10-CM

## 2017-03-06 NOTE — Telephone Encounter (Signed)
The pt has been notified of the EUS and instructed.  Nicholas [name] was able to view the instructions on My Chart while we were on the phone.  Nicholas [name] did ask that I send Dr Havery Moros a message and have Dr Havery Moros call him to discuss some questions Nicholas [name] has.  Dr Havery Moros will you call the pt?

## 2017-03-06 NOTE — Telephone Encounter (Signed)
I called the patient. Reviewed the pathology results and recommendations moving forward. He will undergo EUS with Dr. Ardis Hughs to better determine what type of lesion this is, if it's attached to a deeper level of the stomach (GIST) versus mucosal based, if amenable to endoscopic resection, etc. I discussed risks / benefits of the procedure and he agreed to proceed. I will await the results and contact him when path returns from the exam and discuss long term plan. Thanks for helping to coordinate.

## 2017-03-06 NOTE — Telephone Encounter (Signed)
-----   Message from [name], MD sent at 03/06/2017 10:54 AM EDT ----- Regarding: RE: question, possible EUS D[place],  I bet it's a GIST and EUS with FNA is a good next step here.  If you can let him know, I'll have Ibraham Levi get in touch about scheduling for next week.  Thanks  Lorianna Spadaccini, He needs upper EUS, radial +/- linear for gastric mass.  ++MAC, Thanks   dj ----- Message ----- From: [name], MD Sent: 03/06/2017   7:57 AM To: [name], MD Subject: question, possible EUS                         [name], Another kind of unusual case. This guy came to the ER with a bad enteritis, ended up testing positive for Sapovirus from which he recovered.  As part of his workup in the ED they did a CT which showed possible gastric mass versus polyp. I did an EGD during his stay, showing polypoid gastric mass lesion. It seemed to have a broader base than would expect with a typical polyp. I took a lot of biopsies which show "chronic gastritis with dense lymphoid infiltrate". Not sure if this could be a lymphoma?  Do you think EUS would be helpful at all to determine how deep it goes, see if amenable to endoscopic resection in any way, ensure no GIST, or do you think I should just repeat an EGD with more biopsies?  I took a lot of samples of it initially, not sure if further superficial biopsies will get a definitive diagnosis.  Thanks for your opinion.  [name]

## 2017-03-07 ENCOUNTER — Telehealth: Payer: Self-pay | Admitting: Gastroenterology

## 2017-03-07 NOTE — Telephone Encounter (Signed)
Pt states he can not come for procedure next week on 6.7.18 at [facility] and would like a call back to reschedule.

## 2017-03-08 ENCOUNTER — Telehealth: Payer: Self-pay | Admitting: Gastroenterology

## 2017-03-08 NOTE — Telephone Encounter (Signed)
Left message on machine to call back  

## 2017-03-11 NOTE — Telephone Encounter (Signed)
The pt has been rescheduled to 03/21/17 1015 am. Scheduling in aware as well as the pt

## 2017-03-19 ENCOUNTER — Encounter (HOSPITAL_COMMUNITY): Payer: Self-pay | Admitting: *Deleted

## 2017-03-21 ENCOUNTER — Ambulatory Visit (HOSPITAL_COMMUNITY): Payer: No Typology Code available for payment source | Admitting: Registered Nurse

## 2017-03-21 ENCOUNTER — Encounter (HOSPITAL_COMMUNITY)
Admission: RE | Disposition: A | Discharge status: Home / Self Care | Payer: Self-pay | Source: Ambulatory Visit | Attending: Gastroenterology

## 2017-03-21 ENCOUNTER — Ambulatory Visit (HOSPITAL_COMMUNITY)
Admission: RE | Admit: 2017-03-21 | Discharge: 2017-03-21 | Disposition: A | Discharge status: Home / Self Care | Payer: No Typology Code available for payment source | Source: Ambulatory Visit | Attending: Gastroenterology | Admitting: Gastroenterology

## 2017-03-21 ENCOUNTER — Encounter (HOSPITAL_COMMUNITY): Payer: Self-pay | Admitting: Anesthesiology

## 2017-03-21 DIAGNOSIS — Z8614 Personal history of Methicillin resistant Staphylococcus aureus infection: Secondary | ICD-10-CM | POA: Insufficient documentation

## 2017-03-21 DIAGNOSIS — C8593 Non-Hodgkin lymphoma, unspecified, intra-abdominal lymph nodes: Secondary | ICD-10-CM | POA: Diagnosis not present

## 2017-03-21 DIAGNOSIS — K3189 Other diseases of stomach and duodenum: Secondary | ICD-10-CM | POA: Diagnosis not present

## 2017-03-21 DIAGNOSIS — K319 Disease of stomach and duodenum, unspecified: Secondary | ICD-10-CM

## 2017-03-21 DIAGNOSIS — N400 Enlarged prostate without lower urinary tract symptoms: Secondary | ICD-10-CM | POA: Insufficient documentation

## 2017-03-21 DIAGNOSIS — R933 Abnormal findings on diagnostic imaging of other parts of digestive tract: Secondary | ICD-10-CM | POA: Diagnosis not present

## 2017-03-21 DIAGNOSIS — K529 Noninfective gastroenteritis and colitis, unspecified: Secondary | ICD-10-CM | POA: Insufficient documentation

## 2017-03-21 DIAGNOSIS — I700 Atherosclerosis of aorta: Secondary | ICD-10-CM | POA: Insufficient documentation

## 2017-03-21 DIAGNOSIS — C8599 Non-Hodgkin lymphoma, unspecified, extranodal and solid organ sites: Secondary | ICD-10-CM

## 2017-03-21 HISTORY — PX: EUS: SHX5427

## 2017-03-21 SURGERY — UPPER ENDOSCOPIC ULTRASOUND (EUS) LINEAR
Anesthesia: Monitor Anesthesia Care

## 2017-03-21 MED ORDER — PROPOFOL 10 MG/ML IV BOLUS
INTRAVENOUS | Status: DC | PRN
  Administered 2017-03-21: 20 mg via INTRAVENOUS

## 2017-03-21 MED ORDER — LIDOCAINE 2% (20 MG/ML) 5 ML SYRINGE
INTRAMUSCULAR | Status: DC | PRN
Start: 2017-03-21 — End: 2017-03-21
  Administered 2017-03-21: 50 mg via INTRAVENOUS

## 2017-03-21 MED ORDER — PROPOFOL 10 MG/ML IV BOLUS
INTRAVENOUS | Status: AC
  Filled 2017-03-21: qty 20

## 2017-03-21 MED ORDER — SODIUM CHLORIDE 0.9 % IV SOLN: INTRAVENOUS | Status: DC

## 2017-03-21 MED ORDER — LACTATED RINGERS IV SOLN
INTRAVENOUS | Status: DC
  Administered 2017-03-21: 09:00:00 via INTRAVENOUS

## 2017-03-21 MED ORDER — PROPOFOL 500 MG/50ML IV EMUL
INTRAVENOUS | Status: DC | PRN
  Administered 2017-03-21: 120 ug/kg/min via INTRAVENOUS

## 2017-03-21 NOTE — Transfer of Care (Signed)
Immediate Anesthesia Transfer of Care Note  Patient: Nnamdi Dacus  Procedure(s) Performed: Procedure(s): UPPER ENDOSCOPIC ULTRASOUND (EUS) LINEAR (N/A)  Patient Location: PACU and Endoscopy Unit  Anesthesia Type:MAC  Level of Consciousness: awake, alert , oriented and patient cooperative  Airway & Oxygen Therapy: Patient Spontanous Breathing and Patient connected to nasal cannula oxygen  Post-op Assessment: Report given to RN, Post -op Vital signs reviewed and stable and Patient moving all extremities  Post vital signs: Reviewed and stable  Last Vitals:  Vitals:   03/21/17 0911 03/21/17 1026  BP: (!) 163/95 109/64  Pulse: 70 64  Resp: 20 20  Temp: 36.5 C 36.7 C    Last Pain:  Vitals:   03/21/17 1026  TempSrc: Oral         Complications: No apparent anesthesia complications

## 2017-03-21 NOTE — Anesthesia Preprocedure Evaluation (Signed)
Anesthesia Evaluation  Patient identified by MRN, date of birth, ID band Patient awake    Reviewed: Allergy & Precautions, NPO status , Patient's Chart, lab work & pertinent test results  Airway Mallampati: II  TM Distance: >3 FB Neck ROM: Full    Dental  (+) Teeth Intact   Pulmonary neg pulmonary ROS,    Pulmonary exam normal breath sounds clear to auscultation       Cardiovascular hypertension, Pt. on medications Normal cardiovascular exam Rhythm:Regular Rate:Normal     Neuro/Psych negative neurological ROS  negative psych ROS   GI/Hepatic Gastric mass   Endo/Other  negative endocrine ROS  Renal/GU Renal diseaseHx/o AKI  negative genitourinary   Musculoskeletal Hx/o abscess of both thighs   Abdominal   Peds  Hematology Hx/o MRSA   Anesthesia Other Findings   Reproductive/Obstetrics                             Anesthesia Physical Anesthesia Plan  ASA: II  Anesthesia Plan: MAC   Post-op Pain Management:    Induction: Intravenous  PONV Risk Score and Plan: 1 and Ondansetron and Propofol  Airway Management Planned: Natural Airway and Nasal Cannula  Additional Equipment:   Intra-op Plan:   Post-operative Plan:   Informed Consent: I have reviewed the patients History and Physical, chart, labs and discussed the procedure including the risks, benefits and alternatives for the proposed anesthesia with the patient or authorized representative who has indicated his/her understanding and acceptance.   Dental advisory given  Plan Discussed with: CRNA, Anesthesiologist and Surgeon  Anesthesia Plan Comments:         Anesthesia Quick Evaluation

## 2017-03-21 NOTE — Interval H&P Note (Signed)
History and Physical Interval Note:  03/21/2017 8:59 AM  Nicholas [name]  has presented today for surgery, with the diagnosis of gastric mass   The various methods of treatment have been discussed with the patient and family. After consideration of risks, benefits and other options for treatment, the patient has consented to  Procedure(s): UPPER ENDOSCOPIC ULTRASOUND (EUS) LINEAR (N/A) as a surgical intervention .  The patient's history has been reviewed, patient examined, no change in status, [name] for surgery.  I have reviewed the patient's chart and labs.  Questions were answered to the patient's satisfaction.     [name]

## 2017-03-21 NOTE — H&P (View-Only) (Signed)
Progress Note   Subjective  Patient feels improved this morning, less abdominal distension, but not yet back to his "normal" state. He is passing gas. NG is uncomfortable, about 500cc out. Otherwise no acute events overnight.   Objective   Vital signs in last 24 hours: Temp:  [97.7 F (36.5 C)-98.6 F (37 C)] 98 F (36.7 C) (05/25 0549) Pulse Rate:  [59-95] 63 (05/25 0549) Resp:  [17-18] 18 (05/25 0549) BP: (110-149)/(66-100) 134/80 (05/25 0549) SpO2:  [94 %-98 %] 94 % (05/25 0549) Last BM Date: 02/28/17 General:    white male in NAD Heart:  Regular rate and rhythm; no murmurs Lungs: Respirations even and unlabored, lungs CTA bilaterally Abdomen:  Softer than prior exam, nontender, mildly distended.  Extremities:  Without edema. Neurologic:  Alert and oriented,  grossly normal neurologically. Psych:  Cooperative. Normal mood and affect.  Intake/Output from previous day: 05/24 0701 - 05/25 0700 In: 3289.6 [I.V.:1289.6; IV Piggyback:2000] Out: 900 [Urine:400; Emesis/NG output:500] Intake/Output this shift: No intake/output data recorded.  Lab Results:  Recent Labs  02/28/17 0629 03/01/17 0356  WBC 5.7 5.9  HGB 17.9* 14.4  HCT 50.6 42.2  PLT 177 169   BMET  Recent Labs  02/28/17 0629 03/01/17 0356  NA 136 140  K 3.5 3.3*  CL 103 109  CO2 24 24  GLUCOSE 128* 106*  BUN 15 13  CREATININE 1.26* 1.02  CALCIUM 8.5* 7.7*   LFT  Recent Labs  02/28/17 0629  PROT 6.9  ALBUMIN 3.6  AST 33  ALT 36  ALKPHOS 71  BILITOT 0.9   PT/INR No results for input(s): LABPROT, INR in the last 72 hours.  Studies/Results: Dg Abdomen 1 View  Result Date: 02/28/2017 CLINICAL DATA:  Check enteric catheter placement EXAM: ABDOMEN - 1 VIEW COMPARISON:  None. FINDINGS: Scattered large and small bowel gas is noted. Multiple loops of dilated small bowel seen. Nasogastric catheter is noted within the stomach. The proximal side port lies at the gastroesophageal junction.  IMPRESSION: Changes of small bowel dilatation. Nasogastric catheter within the stomach. Electronically Signed   By: Inez Catalina M.D.   On: 02/28/2017 10:59   Ct Abdomen Pelvis W Contrast  Result Date: 02/28/2017 CLINICAL DATA:  Abdominal pain and distention. Nausea vomiting diarrhea. No prior abdominal surgery. EXAM: CT ABDOMEN AND PELVIS WITH CONTRAST TECHNIQUE: Multidetector CT imaging of the abdomen and pelvis was performed using the standard protocol following bolus administration of intravenous contrast. CONTRAST:  100 mL Isovue 370 IV COMPARISON:  CT pelvis 04/14/2014 FINDINGS: Lower chest: Mild atelectasis in the right posterior lung base. Left lung clear Hepatobiliary: Normal liver.  Gallbladder and bile ducts normal Pancreas: Negative Spleen: Negative Adrenals/Urinary Tract: Normal kidneys. No renal mass or obstruction or stone. Normal urinary bladder. Stomach/Bowel: Pedunculated soft tissue mass in the anterior wall of the gastric antrum measures 27 x 29 mm. Probable neoplasm and biopsy recommended. Dilated small bowel loops with multiple air-fluid levels compatible small bowel obstruction. Transition point in the mid ileum. There is mild thickening of the ileum distal to the obstruction. Colon is decompressed. Normal appendix. Vascular/Lymphatic: Minimal atherosclerotic disease in the aorta. No lymphadenopathy. Reproductive: Mild prostate enlargement Other: Small amount of free fluid in the pelvis. Small amount of free fluid around the spleen. No intra-abdominal abscess Musculoskeletal: Moderate disc degeneration and facet degeneration L5-S1. No acute skeletal abnormality. IMPRESSION: Pedunculated gastric mass compatible with neoplasm. Endoscopy and biopsy recommended. Small bowel obstruction. This appears to be at  the level of the mid ileum. There is thickening of the ileum. This could represent inflammatory bowel disease however the thickening is relatively mild. Small amount of free  intraperitoneal fluid. Negative for abdominal abscess. These results were called by telephone at the time of interpretation on 02/28/2017 at 9:03 am to Dr. Vallery Ridge , who verbally acknowledged these results. Electronically Signed   By: Franchot Gallo M.D.   On: 02/28/2017 09:04       Assessment / Plan:   61 y/o male presenting with acute ileitis / possible SBO - ddx for ileitis includes infectious versus Crohn's versus less likely malignancy.   Thus far he appears better since admission with NG decompression but not yet back to baseline, mild distension persists. Surgery planning small bowel protocol with gastrograffin. Stool studies pending.   Incidentally noted on CT scan there is a polypoid verse mass-like lesion in the antrum. Hopefully this is a benign polyp which can be endoscopically removed, but malignancy is possible and would recommend endoscopy prior to discharge to expedite his workup. I think a bit more time to allow the small bowel to further decompress would be good prior to EGD, will tentatively schedule him for EGD tomorrow, pending his course today. I have discussed risks / benefits of EGD with him, and he wishes to proceed.   Moving forward, the patient will need colonoscopy versus interval enterography study with CT or MRI to re-evaluate ileal findings in upcoming weeks.   Please call with questions.  Eldorado Cellar, MD [facility] Pager [phone]

## 2017-03-21 NOTE — Op Note (Signed)
[facility] Patient Name: Nicholas [name] Procedure Date: 03/21/2017 MRN: [id number] Attending MD: [name] , MD Date of Birth: 1955/10/15 CSN: [id number] Age: 61 Admit Type: Outpatient Procedure:                Upper EUS Indications:              Incidentally noted gastric mass on recent CT during                            admission for viral related enteritis; EGD Dr.                            Havery Moros with non-diagnostic mucosal biopsies,                            lesion appears submucosal Providers:                [name], MD, Cleda Daub, RN, Elspeth Cho Tech., Technician, Courtney Heys. [place], CRNA Referring MD:             Jolly Mango, MD Medicines:                Monitored Anesthesia Care Complications:            No immediate complications. Estimated blood loss:                            None. Estimated Blood Loss:     Estimated blood loss: none. Procedure:                Pre-Anesthesia Assessment:                           - Prior to the procedure, a History and Physical                            was performed, and patient medications and                            allergies were reviewed. The patient's tolerance of                            previous anesthesia was also reviewed. The risks                            and benefits of the procedure and the sedation                            options and risks were discussed with the patient.                            All questions were answered, and informed consent  was obtained. Prior Anticoagulants: The patient has                            taken no previous anticoagulant or antiplatelet                            agents. ASA Grade Assessment: II - A patient with                            mild systemic disease. After reviewing the risks                            and benefits, the patient was deemed in      satisfactory condition to undergo the procedure.                           After obtaining informed consent, the endoscope was                            passed under direct vision. Throughout the                            procedure, the patient's blood pressure, pulse, and                            oxygen saturations were monitored continuously. The                            [id number] ([id number]) scope was introduced through                            the mouth, and advanced to the second part of                            duodenum. The upper EUS was accomplished without                            difficulty. The patient tolerated the procedure                            well. Scope In: Scope Out: Findings:      Endoscopic Finding :      1. The medium-sized, apparently, semipedunculated mass was noted along       anterior wall of the lesser curvature of the stomach.      Endosonographic Finding :      1. The mass described above correlates with a 3cm by 2.5cm hypoechoic       solid mass which appears to communicate with the muscularis propria       (Layer 4) layer of the anterior gastric wall. The mass was       semipedunculated. The outer borders of the mass are slightly irregular.       Fine needle aspiration for cytology was performed. Color Doppler imaging       was utilized prior to needle puncture to confirm a lack  of significant       vascular structures within the needle path. Five passes were made with       the 22 gauge (two passes) needle and with the 25 gauge (three passes)       needle using a transgastric approach. A cytotechnologist was present to       evaluate the adequacy of the specimen. Final cytology results are       pending.      2. No perigastric adenopathy.      3. Limited views of liver, pancreas, spleen, portal and splenic vessels       were all normal. Impression:               - The gastric mass described on recent CT, EGD                             appears submucosal and it communicates with the                            muscularis propria layer of the anterior gastric                            wall, along the greater curvature. The mass is                            semipedunculated, 3cm by 2.5cm across with                            irregular outer borders. Preliminary FNA review is                            suspicious for lymphoma. Await final cytology, flow                            cytometry. Moderate Sedation:      N/A- Per Anesthesia Care Recommendation:           - Discharge patient to home (ambulatory).                           - Await cytology results. Procedure Code(s):        --- Professional ---                           [phone], Esophagogastroduodenoscopy, flexible,                            transoral; with transendoscopic ultrasound-guided                            intramural or transmural fine needle                            aspiration/biopsy(s), (includes endoscopic                            ultrasound examination limited to the esophagus,  stomach or duodenum, and adjacent structures) Diagnosis Code(s):        --- Professional ---                           D49.0, Neoplasm of unspecified behavior of                            digestive system                           K31.89, Other diseases of stomach and duodenum                           R93.3, Abnormal findings on diagnostic imaging of                            other parts of digestive tract CPT copyright 2016 American Medical Association. All rights reserved. The codes documented in this report are preliminary and upon coder review may  be revised to meet current compliance requirements. [name], MD 03/21/2017 10:40:59 AM This report has been signed electronically. Number of Addenda: 0

## 2017-03-21 NOTE — Discharge Instructions (Signed)

## 2017-03-21 NOTE — Anesthesia Postprocedure Evaluation (Signed)
Anesthesia Post Note  Patient: Nicholas [name]  Procedure(s) Performed: Procedure(s) (LRB): UPPER ENDOSCOPIC ULTRASOUND (EUS) LINEAR (N/A)     Patient location during evaluation: PACU Anesthesia Type: MAC Level of consciousness: awake and alert and oriented Pain management: pain level controlled Vital Signs Assessment: post-procedure vital signs reviewed and stable Respiratory status: spontaneous breathing, nonlabored ventilation and respiratory function stable Cardiovascular status: stable and blood pressure returned to baseline Postop Assessment: no signs of nausea or vomiting Anesthetic complications: no    Last Vitals:  Vitals:   03/21/17 0911 03/21/17 1026  BP: (!) 163/95 109/64  Pulse: 70 64  Resp: 20 20  Temp: 36.5 C 36.7 C    Last Pain:  Vitals:   03/21/17 1026  TempSrc: Oral                 Vadhir Mcnay A.

## 2017-03-22 ENCOUNTER — Encounter (HOSPITAL_COMMUNITY): Payer: Self-pay | Admitting: Gastroenterology

## 2017-03-25 ENCOUNTER — Other Ambulatory Visit: Payer: Self-pay

## 2017-03-25 ENCOUNTER — Telehealth: Payer: Self-pay

## 2017-03-25 DIAGNOSIS — C8599 Non-Hodgkin lymphoma, unspecified, extranodal and solid organ sites: Secondary | ICD-10-CM

## 2017-03-25 NOTE — Telephone Encounter (Signed)
Referral placed for medical oncology.

## 2017-03-25 NOTE — Telephone Encounter (Signed)
-----   Message from [name], MD sent at 03/22/2017  4:39 PM EDT ----- Regarding: referral [name] can you please see below. This patient needs a referral to see Oncology if you can help coordinate.   Thanks, Dr. [name] has already spoke with him about it  ----- Message ----- From: [name], MD Sent: 03/21/2017  12:22 PM To: [name], MD  Yes, he is aware of it all.  Thanks  dj  ----- Message ----- From: [name], MD Sent: 03/21/2017  12:07 PM To: [name], MD  [name] thanks for the note and seeing him. Is he aware of preliminary FNA result and need for Oncology referral or do I need to call him? I will take care of it. If you can forward me the final path once you get back that would be great.  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 See full report in EPIC.  The gastric mass described on recent CT, EGD appears submucosal and it communicates with the muscularis propria layer of the anterior gastric wall, along the greater curvature.  The mass is semipedunculated, 3cm by 2.5cm across with irregular outer borders. 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  Await final cytology, flow cytometry; these should be out by Tues, Wed of next week.    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    Thanks  DJ

## 2017-03-27 ENCOUNTER — Telehealth: Payer: Self-pay

## 2017-03-27 ENCOUNTER — Other Ambulatory Visit: Payer: Self-pay

## 2017-03-27 DIAGNOSIS — K3189 Other diseases of stomach and duodenum: Secondary | ICD-10-CM

## 2017-03-27 DIAGNOSIS — C8590 Non-Hodgkin lymphoma, unspecified, unspecified site: Secondary | ICD-10-CM

## 2017-03-27 NOTE — Telephone Encounter (Signed)
Pt has been scheduled for PET scan at [facility] 04/03/17 930 am nothing to eat or drink after midnight.  Amy has been advised that the pt will need a prior auth as soon as possible.  The pt has been notified and instructed.  He is aware of the insurance issues and will call the company himself as well.

## 2017-04-01 ENCOUNTER — Other Ambulatory Visit: Payer: No Typology Code available for payment source

## 2017-04-01 DIAGNOSIS — K3189 Other diseases of stomach and duodenum: Secondary | ICD-10-CM

## 2017-04-01 DIAGNOSIS — C8590 Non-Hodgkin lymphoma, unspecified, unspecified site: Secondary | ICD-10-CM

## 2017-04-03 ENCOUNTER — Encounter (HOSPITAL_COMMUNITY): Payer: PRIVATE HEALTH INSURANCE

## 2017-04-05 ENCOUNTER — Telehealth: Payer: Self-pay | Admitting: Hematology

## 2017-04-05 ENCOUNTER — Encounter: Payer: Self-pay | Admitting: Hematology

## 2017-04-05 LAB — LACTATE DEHYDROGENASE, ISOENZYMES
LDH 1: 22 % (ref 19–38)
LDH 2: 35 % (ref 30–43)
LDH 3: 23 % (ref 16–26)
LDH 4: 9 % (ref 3–12)
LDH 5: 11 % (ref 3–14)
LDH Isoenzymes, Total: 182 U/L (ref 120–250)

## 2017-04-05 NOTE — Telephone Encounter (Signed)
Appt has been scheduled for the pt to see Dr. Burr Medico on 7/13 at 230pm. Pt stated that he is going out of town the wee of the 4th. Pt aware to arrive 30 minutes early. Demographics verified. Letter mailed.

## 2017-04-09 ENCOUNTER — Encounter: Payer: Self-pay | Admitting: Hematology

## 2017-04-15 ENCOUNTER — Ambulatory Visit (HOSPITAL_COMMUNITY)
Admission: RE | Admit: 2017-04-15 | Discharge: 2017-04-15 | Disposition: A | Discharge status: Home / Self Care | Payer: No Typology Code available for payment source | Source: Ambulatory Visit | Attending: Gastroenterology | Admitting: Gastroenterology

## 2017-04-15 DIAGNOSIS — I700 Atherosclerosis of aorta: Secondary | ICD-10-CM | POA: Insufficient documentation

## 2017-04-15 DIAGNOSIS — C8590 Non-Hodgkin lymphoma, unspecified, unspecified site: Secondary | ICD-10-CM | POA: Diagnosis present

## 2017-04-15 DIAGNOSIS — K3189 Other diseases of stomach and duodenum: Secondary | ICD-10-CM

## 2017-04-15 DIAGNOSIS — K319 Disease of stomach and duodenum, unspecified: Secondary | ICD-10-CM | POA: Diagnosis present

## 2017-04-15 LAB — GLUCOSE, CAPILLARY: GLUCOSE-CAPILLARY: 111 mg/dL — AB (ref 65–99)

## 2017-04-15 MED ORDER — FLUDEOXYGLUCOSE F - 18 (FDG) INJECTION
11.5000 | Freq: Once | INTRAVENOUS | Status: AC | PRN
  Administered 2017-04-15: 11.5 via INTRAVENOUS

## 2017-04-16 ENCOUNTER — Telehealth: Payer: Self-pay | Admitting: Gastroenterology

## 2017-04-16 NOTE — Telephone Encounter (Signed)
See result note.  

## 2017-04-17 ENCOUNTER — Other Ambulatory Visit: Payer: Self-pay | Admitting: General Surgery

## 2017-04-19 ENCOUNTER — Ambulatory Visit: Payer: No Typology Code available for payment source | Admitting: Hematology

## 2017-04-19 ENCOUNTER — Telehealth: Payer: Self-pay | Admitting: Hematology

## 2017-04-19 NOTE — Telephone Encounter (Signed)
Will reschedule at a later date he has to see his surgeon before seeing Dr Burr Medico

## 2017-05-20 ENCOUNTER — Telehealth: Payer: Self-pay | Admitting: Gastroenterology

## 2017-05-20 NOTE — Telephone Encounter (Signed)
Returned call to the pt and the mail box is full.  Will wait for the pt to return call.  Will need to know what surgeon his insurance will cover.

## 2017-05-20 NOTE — Telephone Encounter (Signed)
The pt will call his insurance and confirm what surgeon is available.

## 2017-07-22 ENCOUNTER — Other Ambulatory Visit: Payer: Self-pay | Admitting: General Surgery

## 2017-07-23 ENCOUNTER — Encounter (HOSPITAL_COMMUNITY)
Admission: RE | Admit: 2017-07-23 | Discharge: 2017-07-23 | Disposition: A | Discharge status: Home / Self Care | Payer: No Typology Code available for payment source | Source: Ambulatory Visit | Attending: General Surgery | Admitting: General Surgery

## 2017-07-23 ENCOUNTER — Encounter (HOSPITAL_COMMUNITY): Payer: Self-pay

## 2017-07-23 DIAGNOSIS — Z01818 Encounter for other preprocedural examination: Secondary | ICD-10-CM | POA: Diagnosis not present

## 2017-07-23 DIAGNOSIS — I444 Left anterior fascicular block: Secondary | ICD-10-CM | POA: Diagnosis not present

## 2017-07-23 DIAGNOSIS — Z01810 Encounter for preprocedural cardiovascular examination: Secondary | ICD-10-CM | POA: Diagnosis not present

## 2017-07-23 DIAGNOSIS — K9289 Other specified diseases of the digestive system: Secondary | ICD-10-CM | POA: Diagnosis not present

## 2017-07-23 MED ORDER — CHLORHEXIDINE GLUCONATE CLOTH 2 % EX PADS
6.0000 | MEDICATED_PAD | Freq: Once | CUTANEOUS | Status: DC
Start: 2017-07-23 — End: 2017-07-24

## 2017-07-23 MED ORDER — CHLORHEXIDINE GLUCONATE CLOTH 2 % EX PADS: 6.0000 | MEDICATED_PAD | Freq: Once | CUTANEOUS | Status: DC

## 2017-07-23 NOTE — Pre-Procedure Instructions (Signed)
Nicholas [name]  07/23/2017      [facility], [name] [address] Phone: [phone] Fax: [phone]    Your procedure is scheduled on August 14, 2017.  Report to [facility] Admitting at 1050 AM.  Call this number if you have problems the morning of surgery:  [phone]   Remember:  Do not eat food or drink liquids after midnight.  Take these medicines the morning of surgery with A SIP OF WATER acetaminophen (tylenol)-if needed, ondansetron (zofran)-if needed, pantoprazole (protonix), tramadol (ultram)-if needed.   7 days prior to surgery STOP taking any Aspirin, Aleve, Naproxen, Ibuprofen, Motrin, Advil, Goody's, BC's, all herbal medications, fish oil, and all vitamins  Continue all other medications as instructed by your physician except follow the above medication instructions before surgery   Do not wear jewelry, make-up or nail polish.  Do not wear lotions, powders, or perfumes, or deoderant.  Men may shave face and neck.  Do not bring valuables to the hospital.  [facility] is not responsible for any belongings or valuables.  Contacts, dentures or bridgework may not be worn into surgery.  Leave your suitcase in the car.  After surgery it may be brought to your room.  For patients admitted to the hospital, discharge time will be determined by your treatment team.  Patients discharged the day of surgery will not be allowed to drive home.   Special instructions:   [facility]- Preparing For Surgery  Before surgery, you can play an important role. Because skin is not sterile, your skin needs to be as free of germs as possible. You can reduce the number of germs on your skin by washing with CHG (chlorahexidine gluconate) Soap before surgery.  CHG is an antiseptic cleaner which kills germs and bonds with the skin to continue killing germs even after washing.  Please do not  use if you have an allergy to CHG or antibacterial soaps. If your skin becomes reddened/irritated stop using the CHG.  Do not shave (including legs and underarms) for at least 48 hours prior to first CHG shower. It is OK to shave your face.  Please follow these instructions carefully.   1. Shower the NIGHT BEFORE SURGERY and the MORNING OF SURGERY with CHG.   2. If you chose to wash your hair, wash your hair first as usual with your normal shampoo.  3. After you shampoo, rinse your hair and body thoroughly to remove the shampoo.  4. Use CHG as you would any other liquid soap. You can apply CHG directly to the skin and wash gently with a scrungie or a clean washcloth.   5. Apply the CHG Soap to your body ONLY FROM THE NECK DOWN.  Do not use on open wounds or open sores. Avoid contact with your eyes, ears, mouth and genitals (private parts). Wash Face and genitals (private parts)  with your normal soap.  6. Wash thoroughly, paying special attention to the area where your surgery will be performed.  7. Thoroughly rinse your body with warm water from the neck down.  8. DO NOT shower/wash with your normal soap after using and rinsing off the CHG Soap.  9. Pat yourself dry with a CLEAN TOWEL.  10. Wear CLEAN PAJAMAS to bed the night before surgery, wear comfortable clothes the morning of surgery  11. Place CLEAN SHEETS on your bed the night of your first  shower and DO NOT SLEEP WITH PETS.    Day of Surgery: Do not apply any deodorants/lotions. Please wear clean clothes to the hospital/surgery center.     Please read over the following fact sheets that you were given. Pain Booklet, Coughing and Deep Breathing and Surgical Site Infection Prevention

## 2017-07-23 NOTE — Progress Notes (Addendum)
PCP: Dr. Velna Hatchet  Cardiologist: pt denies  EKG: pt denies past year  Stress test: pt denies ever  ECHO: pt denies ever  Cardiac Cath: pt denies ever  Chest x-ray:pt denies past year  Paged pharmacy x2, patient unable to wait for them to arrive.  He advised that his medication list looks correct and he can verify with pharmacy day of surgery or over the phone.  Pt appointment scheduled over 3 weeks in advance, I attempted to get this rescheduled yesterday but there was a miscommunication and the patient came to this appointment. CBC and BMP will need to be drawn DOS per protocol.    Pt has not received any information from Dr. Barry Dienes regarding bowel protocol.  I gave him the information sheet and advised him to call the office and determine what exactly he needs to do before procedure. He believes he will have an appointment with her before his surgery.

## 2017-08-14 ENCOUNTER — Inpatient Hospital Stay (HOSPITAL_COMMUNITY)
Admission: RE | Admit: 2017-08-14 | Discharge: 2017-08-15 | DRG: 392 | Disposition: A | Discharge status: Home / Self Care | Payer: PRIVATE HEALTH INSURANCE | Source: Ambulatory Visit | Attending: General Surgery | Admitting: General Surgery

## 2017-08-14 ENCOUNTER — Inpatient Hospital Stay (HOSPITAL_COMMUNITY): Payer: PRIVATE HEALTH INSURANCE | Admitting: Certified Registered Nurse Anesthetist

## 2017-08-14 ENCOUNTER — Encounter (HOSPITAL_COMMUNITY): Payer: Self-pay | Admitting: Urology

## 2017-08-14 ENCOUNTER — Encounter (HOSPITAL_COMMUNITY)
Admission: RE | Disposition: A | Discharge status: Home / Self Care | Payer: Self-pay | Source: Ambulatory Visit | Attending: General Surgery

## 2017-08-14 DIAGNOSIS — K319 Disease of stomach and duodenum, unspecified: Secondary | ICD-10-CM | POA: Diagnosis present

## 2017-08-14 DIAGNOSIS — K3189 Other diseases of stomach and duodenum: Secondary | ICD-10-CM | POA: Diagnosis present

## 2017-08-14 DIAGNOSIS — I10 Essential (primary) hypertension: Secondary | ICD-10-CM | POA: Diagnosis present

## 2017-08-14 DIAGNOSIS — K219 Gastro-esophageal reflux disease without esophagitis: Secondary | ICD-10-CM | POA: Diagnosis present

## 2017-08-14 DIAGNOSIS — Z820 Family history of epilepsy and other diseases of the nervous system: Secondary | ICD-10-CM | POA: Diagnosis not present

## 2017-08-14 DIAGNOSIS — Z8249 Family history of ischemic heart disease and other diseases of the circulatory system: Secondary | ICD-10-CM

## 2017-08-14 HISTORY — PX: LAPAROSCOPIC PARTIAL GASTRECTOMY: SHX5908

## 2017-08-14 LAB — CBC
HCT: 48.4 % (ref 39.0–52.0)
Hemoglobin: 16.8 g/dL (ref 13.0–17.0)
MCH: 33.2 pg (ref 26.0–34.0)
MCHC: 34.7 g/dL (ref 30.0–36.0)
MCV: 95.7 fL (ref 78.0–100.0)
PLATELETS: 187 10*3/uL (ref 150–400)
RBC: 5.06 MIL/uL (ref 4.22–5.81)
RDW: 13.3 % (ref 11.5–15.5)
WBC: 4.1 10*3/uL (ref 4.0–10.5)

## 2017-08-14 LAB — BASIC METABOLIC PANEL
Anion gap: 8 (ref 5–15)
BUN: 12 mg/dL (ref 6–20)
CO2: 21 mmol/L — ABNORMAL LOW (ref 22–32)
CREATININE: 1.14 mg/dL (ref 0.61–1.24)
Calcium: 8.8 mg/dL — ABNORMAL LOW (ref 8.9–10.3)
Chloride: 107 mmol/L (ref 101–111)
GFR calc Af Amer: >60 mL/min (ref >60)
GLUCOSE: 92 mg/dL (ref 65–99)
Potassium: 4 mmol/L (ref 3.5–5.1)
SODIUM: 136 mmol/L (ref 135–145)

## 2017-08-14 SURGERY — LAPAROSCOPIC PARTIAL GASTRECTOMY
Anesthesia: General | Site: Abdomen

## 2017-08-14 MED ORDER — DEXAMETHASONE SODIUM PHOSPHATE 10 MG/ML IJ SOLN
INTRAMUSCULAR | Status: DC | PRN
  Administered 2017-08-14: 50 mg via INTRAVENOUS

## 2017-08-14 MED ORDER — PROPOFOL 10 MG/ML IV BOLUS
INTRAVENOUS | Status: DC | PRN
  Administered 2017-08-14: 200 mg via INTRAVENOUS

## 2017-08-14 MED ORDER — ONDANSETRON 4 MG PO TBDP: 4.0000 mg | ORAL_TABLET | Freq: Four times a day (QID) | ORAL | Status: DC | PRN

## 2017-08-14 MED ORDER — HYDROMORPHONE HCL 1 MG/ML IJ SOLN
0.2500 mg | INTRAMUSCULAR | Status: DC | PRN
  Administered 2017-08-14: 0.5 mg via INTRAVENOUS

## 2017-08-14 MED ORDER — LISINOPRIL-HYDROCHLOROTHIAZIDE 20-12.5 MG PO TABS: 1.0000 | ORAL_TABLET | Freq: Every evening | ORAL | Status: DC

## 2017-08-14 MED ORDER — ACETAMINOPHEN 500 MG PO TABS
1000.0000 mg | ORAL_TABLET | Freq: Four times a day (QID) | ORAL | Status: DC
  Administered 2017-08-15 (×2): 1000 mg via ORAL
  Filled 2017-08-14 (×3): qty 2

## 2017-08-14 MED ORDER — LIDOCAINE HCL 1 % IJ SOLN
INTRAMUSCULAR | Status: DC | PRN
  Administered 2017-08-14: 11 mL

## 2017-08-14 MED ORDER — DEXAMETHASONE SODIUM PHOSPHATE 10 MG/ML IJ SOLN
INTRAMUSCULAR | Status: AC
  Filled 2017-08-14: qty 1

## 2017-08-14 MED ORDER — ONDANSETRON HCL 4 MG/2ML IJ SOLN
INTRAMUSCULAR | Status: AC
  Filled 2017-08-14: qty 2

## 2017-08-14 MED ORDER — MIDAZOLAM HCL 2 MG/2ML IJ SOLN
INTRAMUSCULAR | Status: AC
  Filled 2017-08-14: qty 2

## 2017-08-14 MED ORDER — MEPERIDINE HCL 25 MG/ML IJ SOLN: 6.2500 mg | INTRAMUSCULAR | Status: DC | PRN

## 2017-08-14 MED ORDER — LIDOCAINE HCL 1 % IJ SOLN
INTRAMUSCULAR | Status: AC
  Filled 2017-08-14: qty 20

## 2017-08-14 MED ORDER — PROMETHAZINE HCL 25 MG/ML IJ SOLN: 6.2500 mg | INTRAMUSCULAR | Status: DC | PRN

## 2017-08-14 MED ORDER — PANTOPRAZOLE SODIUM 40 MG IV SOLR
40.0000 mg | Freq: Every day | INTRAVENOUS | Status: DC
  Administered 2017-08-14: 40 mg via INTRAVENOUS
  Filled 2017-08-14: qty 40

## 2017-08-14 MED ORDER — HYDROCHLOROTHIAZIDE 12.5 MG PO CAPS: 12.5000 mg | ORAL_CAPSULE | Freq: Every day | ORAL | Status: DC

## 2017-08-14 MED ORDER — DIPHENHYDRAMINE HCL 12.5 MG/5ML PO ELIX: 12.5000 mg | ORAL_SOLUTION | Freq: Four times a day (QID) | ORAL | Status: DC | PRN

## 2017-08-14 MED ORDER — SUGAMMADEX SODIUM 200 MG/2ML IV SOLN
INTRAVENOUS | Status: DC | PRN
  Administered 2017-08-14: 200 mg via INTRAVENOUS

## 2017-08-14 MED ORDER — FENTANYL CITRATE (PF) 100 MCG/2ML IJ SOLN
INTRAMUSCULAR | Status: DC | PRN
  Administered 2017-08-14: 100 ug via INTRAVENOUS
  Administered 2017-08-14: 50 ug via INTRAVENOUS
  Administered 2017-08-14: 150 ug via INTRAVENOUS
  Administered 2017-08-14 (×2): 50 ug via INTRAVENOUS

## 2017-08-14 MED ORDER — ONDANSETRON HCL 4 MG/2ML IJ SOLN
INTRAMUSCULAR | Status: DC | PRN
  Administered 2017-08-14: 4 mg via INTRAVENOUS

## 2017-08-14 MED ORDER — LIDOCAINE HCL (CARDIAC) 20 MG/ML IV SOLN
INTRAVENOUS | Status: DC | PRN
  Administered 2017-08-14: 100 mg via INTRAVENOUS

## 2017-08-14 MED ORDER — LISINOPRIL 20 MG PO TABS: 20.0000 mg | ORAL_TABLET | Freq: Every day | ORAL | Status: DC

## 2017-08-14 MED ORDER — BUPIVACAINE-EPINEPHRINE (PF) 0.25% -1:200000 IJ SOLN
INTRAMUSCULAR | Status: AC
  Filled 2017-08-14: qty 30

## 2017-08-14 MED ORDER — OXYCODONE HCL 5 MG PO TABS
5.0000 mg | ORAL_TABLET | ORAL | Status: DC | PRN
  Administered 2017-08-14: 10 mg via ORAL
  Administered 2017-08-14 – 2017-08-15 (×2): 5 mg via ORAL
  Filled 2017-08-14: qty 2
  Filled 2017-08-14 (×2): qty 1

## 2017-08-14 MED ORDER — CIPROFLOXACIN IN D5W 400 MG/200ML IV SOLN
400.0000 mg | Freq: Two times a day (BID) | INTRAVENOUS | Status: AC
  Administered 2017-08-15: 400 mg via INTRAVENOUS
  Filled 2017-08-14: qty 200

## 2017-08-14 MED ORDER — PHENYLEPHRINE 40 MCG/ML (10ML) SYRINGE FOR IV PUSH (FOR BLOOD PRESSURE SUPPORT)
PREFILLED_SYRINGE | INTRAVENOUS | Status: DC | PRN
  Administered 2017-08-14: 40 ug via INTRAVENOUS
  Administered 2017-08-14: 80 ug via INTRAVENOUS
  Administered 2017-08-14: 160 ug via INTRAVENOUS
  Administered 2017-08-14: 120 ug via INTRAVENOUS

## 2017-08-14 MED ORDER — ONDANSETRON HCL 4 MG/2ML IJ SOLN: 4.0000 mg | Freq: Four times a day (QID) | INTRAMUSCULAR | Status: DC | PRN

## 2017-08-14 MED ORDER — MIDAZOLAM HCL 2 MG/2ML IJ SOLN: 0.5000 mg | Freq: Once | INTRAMUSCULAR | Status: DC | PRN

## 2017-08-14 MED ORDER — CIPROFLOXACIN IN D5W 400 MG/200ML IV SOLN
400.0000 mg | INTRAVENOUS | Status: AC
  Administered 2017-08-14: 400 mg via INTRAVENOUS
  Filled 2017-08-14: qty 200

## 2017-08-14 MED ORDER — ROCURONIUM BROMIDE 100 MG/10ML IV SOLN
INTRAVENOUS | Status: DC | PRN
  Administered 2017-08-14: 20 mg via INTRAVENOUS
  Administered 2017-08-14: 50 mg via INTRAVENOUS

## 2017-08-14 MED ORDER — HYDROMORPHONE HCL 1 MG/ML IJ SOLN: 0.5000 mg | INTRAMUSCULAR | Status: DC | PRN

## 2017-08-14 MED ORDER — PROPOFOL 10 MG/ML IV BOLUS
INTRAVENOUS | Status: AC
  Filled 2017-08-14: qty 20

## 2017-08-14 MED ORDER — SIMETHICONE 80 MG PO CHEW: 40.0000 mg | CHEWABLE_TABLET | Freq: Four times a day (QID) | ORAL | Status: DC | PRN

## 2017-08-14 MED ORDER — MIDAZOLAM HCL 5 MG/5ML IJ SOLN
INTRAMUSCULAR | Status: DC | PRN
  Administered 2017-08-14: 2 mg via INTRAVENOUS

## 2017-08-14 MED ORDER — HYDROMORPHONE HCL 1 MG/ML IJ SOLN
INTRAMUSCULAR | Status: AC
  Filled 2017-08-14: qty 1

## 2017-08-14 MED ORDER — FENTANYL CITRATE (PF) 250 MCG/5ML IJ SOLN
INTRAMUSCULAR | Status: AC
  Filled 2017-08-14: qty 5

## 2017-08-14 MED ORDER — PHENYLEPHRINE 40 MCG/ML (10ML) SYRINGE FOR IV PUSH (FOR BLOOD PRESSURE SUPPORT)
PREFILLED_SYRINGE | INTRAVENOUS | Status: AC
  Filled 2017-08-14: qty 10

## 2017-08-14 MED ORDER — KCL IN DEXTROSE-NACL 20-5-0.45 MEQ/L-%-% IV SOLN
INTRAVENOUS | Status: AC
  Administered 2017-08-14 – 2017-08-15 (×3): via INTRAVENOUS
  Filled 2017-08-14 (×2): qty 1000

## 2017-08-14 MED ORDER — DIPHENHYDRAMINE HCL 50 MG/ML IJ SOLN
12.5000 mg | Freq: Four times a day (QID) | INTRAMUSCULAR | Status: DC | PRN
Start: 2017-08-14 — End: 2017-08-15

## 2017-08-14 MED ORDER — LIDOCAINE 2% (20 MG/ML) 5 ML SYRINGE
INTRAMUSCULAR | Status: AC
  Filled 2017-08-14: qty 15

## 2017-08-14 MED ORDER — SCOPOLAMINE 1 MG/3DAYS TD PT72
MEDICATED_PATCH | TRANSDERMAL | Status: DC | PRN
  Administered 2017-08-14: 1 via TRANSDERMAL

## 2017-08-14 MED ORDER — LACTATED RINGERS IV SOLN
INTRAVENOUS | Status: DC
  Administered 2017-08-14 (×2): via INTRAVENOUS

## 2017-08-14 MED ORDER — METHOCARBAMOL 500 MG PO TABS: 500.0000 mg | ORAL_TABLET | Freq: Four times a day (QID) | ORAL | Status: DC | PRN

## 2017-08-14 MED ORDER — SUGAMMADEX SODIUM 200 MG/2ML IV SOLN
INTRAVENOUS | Status: AC
Start: 2017-08-14 — End: 2017-08-14
  Filled 2017-08-14: qty 2

## 2017-08-14 MED ORDER — GABAPENTIN 300 MG PO CAPS
300.0000 mg | ORAL_CAPSULE | Freq: Two times a day (BID) | ORAL | Status: DC
  Administered 2017-08-14 – 2017-08-15 (×2): 300 mg via ORAL
  Filled 2017-08-14 (×2): qty 1

## 2017-08-14 MED ORDER — SODIUM CHLORIDE 0.9 % IR SOLN
Status: DC | PRN
  Administered 2017-08-14: 1000 mL

## 2017-08-14 MED ORDER — TRAMADOL HCL 50 MG PO TABS: 50.0000 mg | ORAL_TABLET | Freq: Four times a day (QID) | ORAL | Status: DC | PRN

## 2017-08-14 MED ORDER — HYDRALAZINE HCL 20 MG/ML IJ SOLN: 10.0000 mg | INTRAMUSCULAR | Status: DC | PRN

## 2017-08-14 SURGICAL SUPPLY — 71 items
BLADE CLIPPER SURG (BLADE) IMPLANT
CANISTER SUCT 3000ML PPV (MISCELLANEOUS) ×2 IMPLANT
CHLORAPREP W/TINT 26ML (MISCELLANEOUS) ×2 IMPLANT
COVER SURGICAL LIGHT HANDLE (MISCELLANEOUS) ×2 IMPLANT
DERMABOND ADVANCED (GAUZE/BANDAGES/DRESSINGS) ×1
DERMABOND ADVANCED .7 DNX12 (GAUZE/BANDAGES/DRESSINGS) ×1 IMPLANT
DRAIN CHANNEL 19F RND (DRAIN) IMPLANT
DRAIN PENROSE 1/2X36 STERILE (WOUND CARE) IMPLANT
DRAPE UTILITY XL STRL (DRAPES) ×4 IMPLANT
DRAPE WARM FLUID 44X44 (DRAPE) ×2 IMPLANT
DRSG COVADERM 4X10 (GAUZE/BANDAGES/DRESSINGS) IMPLANT
DRSG COVADERM 4X14 (GAUZE/BANDAGES/DRESSINGS) IMPLANT
ELECT BLADE 6.5 EXT (BLADE) IMPLANT
ELECT CAUTERY BLADE 6.4 (BLADE) IMPLANT
ELECT REM PT RETURN 9FT ADLT (ELECTROSURGICAL) ×2
ELECTRODE REM PT RTRN 9FT ADLT (ELECTROSURGICAL) ×1 IMPLANT
EVACUATOR SILICONE 100CC (DRAIN) IMPLANT
GAUZE SPONGE 4X4 12PLY STRL (GAUZE/BANDAGES/DRESSINGS) IMPLANT
GLOVE BIO SURGEON STRL SZ 6 (GLOVE) ×4 IMPLANT
GLOVE BIOGEL PI IND STRL 6.5 (GLOVE) ×1 IMPLANT
GLOVE BIOGEL PI INDICATOR 6.5 (GLOVE) ×1
GOWN STRL REUS W/ TWL LRG LVL3 (GOWN DISPOSABLE) ×3 IMPLANT
GOWN STRL REUS W/TWL 2XL LVL3 (GOWN DISPOSABLE) ×2 IMPLANT
GOWN STRL REUS W/TWL LRG LVL3 (GOWN DISPOSABLE) ×3
HEMOSTAT SURGICEL 2X14 (HEMOSTASIS) IMPLANT
KIT BASIN OR (CUSTOM PROCEDURE TRAY) ×2 IMPLANT
KIT ROOM TURNOVER OR (KITS) ×2 IMPLANT
L-HOOK LAP DISP 36CM (ELECTROSURGICAL) ×2
LHOOK LAP DISP 36CM (ELECTROSURGICAL) ×1 IMPLANT
LOOP VESSEL MAXI BLUE (MISCELLANEOUS) IMPLANT
NS IRRIG 1000ML POUR BTL (IV SOLUTION) ×4 IMPLANT
PAD ARMBOARD 7.5X6 YLW CONV (MISCELLANEOUS) ×4 IMPLANT
PENCIL BUTTON HOLSTER BLD 10FT (ELECTRODE) ×2 IMPLANT
POUCH SPECIMEN RETRIEVAL 10MM (ENDOMECHANICALS) ×2 IMPLANT
RELOAD BLUE (STAPLE) IMPLANT
RELOAD STAPLER BLUE 60MM (STAPLE) ×3 IMPLANT
RELOAD STAPLER WHITE 60MM (STAPLE) IMPLANT
SEALANT SURGICAL APPL DUAL CAN (MISCELLANEOUS) IMPLANT
SET IRRIG TUBING LAPAROSCOPIC (IRRIGATION / IRRIGATOR) ×2 IMPLANT
SHEARS HARMONIC ACE PLUS 36CM (ENDOMECHANICALS) ×2 IMPLANT
SLEEVE ENDOPATH XCEL 5M (ENDOMECHANICALS) ×4 IMPLANT
STAPLE ECHEON FLEX 60 POW ENDO (STAPLE) ×2 IMPLANT
STAPLER RELOAD BLUE 60MM (STAPLE) ×6
STAPLER RELOAD WHITE 60MM (STAPLE)
STAPLER STANDARD HANDLE (STAPLE) IMPLANT
STAPLER VISISTAT 35W (STAPLE) IMPLANT
SUT ETHILON 2 0 FS 18 (SUTURE) IMPLANT
SUT MNCRL AB 4-0 PS2 18 (SUTURE) ×2 IMPLANT
SUT PDS AB 1 TP1 96 (SUTURE) IMPLANT
SUT PDS II 0 TP-1 LOOPED 60 (SUTURE) IMPLANT
SUT SILK 2 0 SH CR/8 (SUTURE) ×2 IMPLANT
SUT SILK 2 0 TIES 10X30 (SUTURE) ×2 IMPLANT
SUT SILK 3 0 SH CR/8 (SUTURE) ×2 IMPLANT
SUT SILK 3 0 TIES 10X30 (SUTURE) ×2 IMPLANT
SUT VICRYL 0 UR6 27IN ABS (SUTURE) ×2 IMPLANT
SYS LAPSCP GELPORT 120MM (MISCELLANEOUS)
SYSTEM LAPSCP GELPORT 120MM (MISCELLANEOUS) IMPLANT
TIP INNERVISION DETACH 40FR (MISCELLANEOUS) IMPLANT
TIP INNERVISION DETACH 50FR (MISCELLANEOUS) IMPLANT
TIP INNERVISION DETACH 56FR (MISCELLANEOUS) IMPLANT
TIPS INNERVISION DETACH 40FR (MISCELLANEOUS)
TOWEL OR 17X24 6PK STRL BLUE (TOWEL DISPOSABLE) ×2 IMPLANT
TOWEL OR 17X26 10 PK STRL BLUE (TOWEL DISPOSABLE) ×2 IMPLANT
TRAY FOLEY W/METER SILVER 14FR (SET/KITS/TRAYS/PACK) ×2 IMPLANT
TRAY LAPAROSCOPIC MC (CUSTOM PROCEDURE TRAY) ×2 IMPLANT
TROCAR XCEL 12X100 BLDLESS (ENDOMECHANICALS) IMPLANT
TROCAR XCEL BLUNT TIP 100MML (ENDOMECHANICALS) ×2 IMPLANT
TROCAR XCEL NON-BLD 5MMX100MML (ENDOMECHANICALS) ×2 IMPLANT
TUBE CONNECTING 12X1/4 (SUCTIONS) IMPLANT
TUBING INSUF HEATED (TUBING) ×2 IMPLANT
YANKAUER SUCT BULB TIP NO VENT (SUCTIONS) IMPLANT

## 2017-08-14 NOTE — Interval H&P Note (Signed)
History and Physical Interval Note:  08/14/2017 12:48 PM  Nicholas [name]  has presented today for surgery, with the diagnosis of GASTRIC MASS  The various methods of treatment have been discussed with the patient and family. After consideration of risks, benefits and other options for treatment, the patient has consented to  Procedure(s): LAPAROSCOPIC PARTIAL GASTRECTOMY (N/A) as a surgical intervention .  The patient's history has been reviewed, patient examined, no change in status, stable for surgery.  I have reviewed the patient's chart and labs.  Questions were answered to the patient's satisfaction.     Melquan Ernsberger

## 2017-08-14 NOTE — Transfer of Care (Signed)
Immediate Anesthesia Transfer of Care Note  Patient: Nicholas [name]  Procedure(s) Performed: LAPAROSCOPIC PARTIAL GASTRECTOMY (N/A Abdomen)  Patient Location: PACU  Anesthesia Type:General  Level of Consciousness: awake, alert , oriented and patient cooperative  Airway & Oxygen Therapy: Patient Spontanous Breathing  Post-op Assessment: Report given to RN and Post -op Vital signs reviewed and stable  Post vital signs: Reviewed and stable  Last Vitals:  Vitals:   08/14/17 1053 08/14/17 1115  BP: (!) 156/107 (!) 144/102  Pulse: 71   Resp: 19   Temp: 36.9 C   SpO2: 99%     Last Pain:  Vitals:   08/14/17 1053  TempSrc: Oral         Complications: No apparent anesthesia complications

## 2017-08-14 NOTE — Anesthesia Procedure Notes (Signed)
Procedure Name: Intubation Date/Time: 08/14/2017 1:09 PM Performed by: White, Amedeo Plenty, CRNA Pre-anesthesia Checklist: Patient identified, Emergency Drugs available, Suction available and Patient being monitored Patient Re-evaluated:Patient Re-evaluated prior to induction Oxygen Delivery Method: Circle System Utilized Preoxygenation: Pre-oxygenation with 100% oxygen Induction Type: IV induction Ventilation: Mask ventilation without difficulty Laryngoscope Size: Mac and 4 Grade View: Grade I Tube type: Oral Tube size: 7.5 mm Number of attempts: 1 Airway Equipment and Method: Stylet Placement Confirmation: ETT inserted through vocal cords under direct vision,  positive ETCO2 and breath sounds checked- equal and bilateral Secured at: 24 cm Tube secured with: Tape Dental Injury: Teeth and Oropharynx as per pre-operative assessment

## 2017-08-14 NOTE — Anesthesia Postprocedure Evaluation (Signed)
Anesthesia Post Note  Patient: Nicholas [name]  Procedure(s) Performed: LAPAROSCOPIC PARTIAL GASTRECTOMY (N/A Abdomen)     Patient location during evaluation: PACU Anesthesia Type: General Level of consciousness: awake and alert Pain management: pain level controlled Vital Signs Assessment: post-procedure vital signs reviewed and stable Respiratory status: spontaneous breathing, nonlabored ventilation and respiratory function stable Cardiovascular status: blood pressure returned to baseline and stable Postop Assessment: no apparent nausea or vomiting Anesthetic complications: no    Last Vitals:  Vitals:   08/14/17 1515 08/14/17 1530  BP: 117/85 130/85  Pulse: 77 78  Resp: 12 14  Temp:    SpO2: 94% 94%    Last Pain:  Vitals:   08/14/17 1530  TempSrc:   PainSc: Asleep                 Lynda Rainwater

## 2017-08-14 NOTE — Discharge Instructions (Signed)
[facility]      [facility] °[phone] ° °ABDOMINAL SURGERY: POST OP INSTRUCTIONS ° °Always review your discharge instruction sheet given to you by the facility where your surgery was performed. ° °IF YOU HAVE DISABILITY OR FAMILY LEAVE FORMS, YOU MUST BRING THEM TO THE OFFICE FOR PROCESSING.  PLEASE DO NOT GIVE THEM TO YOUR DOCTOR. ° °1. A prescription for pain medication may be given to you upon discharge.  Take your pain medication as prescribed, if needed.  If narcotic pain medicine is not needed, then you may take acetaminophen (Tylenol) or ibuprofen (Advil) as needed. °2. Take your usually prescribed medications unless otherwise directed. °3. If you need a refill on your pain medication, please contact your pharmacy. They will contact our office to request authorization.  Prescriptions will not be filled after 5pm or on week-ends. °4. You should follow a light diet the first few days after arrival home, such as soup and crackers, pudding, etc.unless your doctor has advised otherwise. A high-fiber, low fat diet can be resumed as tolerated.   Be sure to include lots of fluids daily. Most patients will experience some swelling and bruising on the chest and neck area.  Ice packs will help.  Swelling and bruising can take several days to resolve °5. Most patients will experience some swelling and bruising in the area of the incision. Ice pack will help. Swelling and bruising can take several days to resolve..  °6. It is common to experience some constipation if taking pain medication after surgery.  Increasing fluid intake and taking a stool softener will usually help or prevent this problem from occurring.  A mild laxative (Milk of Magnesia or Miralax) should be taken according to package directions if there are no bowel movements after 48 hours. °7.  You may have steri-strips (small skin tapes) in place directly over the incision.  These strips should be left on the skin for 10-14 days.  If your  surgeon used skin glue on the incision, you may shower in 48 hours.  The glue will flake off over the next 2-3 weeks.  Any sutures or staples will be removed at the office during your follow-up visit. You may find that a light gauze bandage over your incision may keep your staples from being rubbed or pulled. You may shower and replace the bandage daily. °8. ACTIVITIES:  You may resume regular (light) daily activities beginning the next day--such as daily self-care, walking, climbing stairs--gradually increasing activities as tolerated.  You may have sexual intercourse when it is comfortable.  Refrain from any heavy lifting or straining until approved by your doctor. °a. You may drive when you no longer are taking prescription pain medication, you can comfortably wear a seatbelt, and you can safely maneuver your car and apply brakes °b. Return to Work: __________12 weeks if applicable_________________________ °9. You should see your doctor in the office for a follow-up appointment approximately two weeks after your surgery.  Make sure that you call for this appointment within a day or two after you arrive home to insure a convenient appointment time. °OTHER INSTRUCTIONS:  °_____________________________________________________________ °_____________________________________________________________ ° °WHEN TO CALL YOUR DOCTOR: °1. Fever over 101.0 °2. Inability to urinate °3. Nausea and/or vomiting °4. Extreme swelling or bruising °5. Continued bleeding from incision. °6. Increased pain, redness, or drainage from the incision. °7. Difficulty swallowing or breathing °8. Muscle cramping or spasms. °9. Numbness or tingling in hands or feet or around lips. ° °The clinic staff is   available to answer your questions during regular business hours.  Please don’t hesitate to call and ask to speak to one of the nurses if you have concerns. ° °For further questions, please visit [email or web address] ° ° ° °

## 2017-08-14 NOTE — H&P (Signed)
Annette Stable Location: [facility] Patient #: [id number] DOB: 1956-02-21 Divorced / Language: [place] [name] / Race: White Male   History of Present Illness The patient is a 61 year old male who presents with a complaint of Mass. Pt is a 61 yo M referred by Dr. Ardis Hughs for consultation regarding a gastric mass. The patient was admitted to the hospital in May 2018 for what sounds like gastroenteritis. 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 This was biopsied during EGD by Dr. Hilarie Fredrickson and was negative for malignancy. He then had EUS and had an atypical lymphocyte proliferation, but flow cytometry was not dx of lymphoma. PET was negative as well. He presents for surgical evaluation. He denies epigastric pain or early satiety. He is a Clinical cytogeneticist.   CT abd/pelvis 02/28/2017 IMPRESSION: Pedunculated gastric mass compatible with neoplasm. Endoscopy and biopsy recommended.  Small bowel obstruction. This appears to be at the level of the mid ileum. There is thickening of the ileum. This could represent inflammatory bowel disease however the thickening is relatively mild.  Small amount of free intraperitoneal fluid. Negative for abdominal abscess.  EGD 03/02/17 A polypoid mass with no bleeding and no stigmata of recent bleeding was found on the greater curvature of the proximal gastric antrum, roughly 3cm in size or so. It had a broad base to it, endoscopic removal was not attempted during this exam. Biopsies were taken with a cold forceps for histology to rule out malignancy. An area a few cm proximal to the lesion was tattooed with an injection of Spot (carbon black) (in case endoscopic removal is possible, the lesion itself was not tattooed).  EUS 03/21/2017 - The gastric mass described on recent CT, EGD appears submucosal and it communicates with the muscularis propria layer of the anterior gastric wall, along the  greater curvature. The mass is semipedunculated, 3cm by 2.5cm across with irregular outer borders. 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 Await final cytology, flow cytometry.  PET scan 04/15/2017  IMPRESSION: 1. 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 No evidence of hypermetabolic metastatic disease. 2. Aortic atherosclerosis (ICD10-170.0).    Past Surgical History Tonsillectomy   Diagnostic Studies History Colonoscopy  1-5 years ago  Allergies Penicillins   Medication History  Vitamin D3 (5000UNIT Tablet, Oral) Active. Lisinopril-Hydrochlorothiazide (20-12.5MG  Tablet, Oral) Active. Medications Reconciled  Social History  Alcohol use  Occasional alcohol use. Caffeine use  Tea. Tobacco use  Never smoker.  Family History Hypertension  Mother. Seizure disorder  Mother.    Review of Systems  General Not Present- Appetite Loss, Chills, Fatigue, Fever, Night Sweats, Weight Gain and Weight Loss. Skin Not Present- Change in Wart/Mole, Dryness, Hives, Jaundice, New Lesions, Non-Healing Wounds, Rash and Ulcer. HEENT Not Present- Earache, Hearing Loss, Hoarseness, Nose Bleed, Oral Ulcers, Ringing in the Ears, Seasonal Allergies, Sinus Pain, Sore Throat, Visual Disturbances, Wears glasses/contact lenses and Yellow Eyes. Respiratory Not Present- Bloody sputum, Chronic Cough, Difficulty Breathing, Snoring and Wheezing. Cardiovascular Not Present- Chest Pain, Difficulty Breathing Lying Down, Leg Cramps, Palpitations, Rapid Heart Rate, Shortness of Breath and Swelling of Extremities. Gastrointestinal Not Present- Abdominal Pain, Bloating, Bloody Stool, Change in Bowel Habits, Chronic diarrhea, Constipation, Difficulty Swallowing, Excessive gas, Gets full quickly at meals, Hemorrhoids, Indigestion, Nausea, Rectal Pain and Vomiting. Male Genitourinary Not Present- Blood in Urine, Change in Urinary Stream, Frequency, Impotence, Nocturia, Painful  Urination, Urgency and Urine Leakage. Musculoskeletal Not Present- Back Pain,  Joint Pain, Joint Stiffness, Muscle Pain, Muscle Weakness and Swelling of Extremities. Neurological Not Present- Decreased Memory, Fainting, Headaches, Numbness, Seizures, Tingling, Tremor, Trouble walking and Weakness. Psychiatric Not Present- Anxiety, Bipolar, Change in Sleep Pattern, Depression, Fearful and Frequent crying. Endocrine Not Present- Cold Intolerance, Excessive Hunger, Hair Changes, Heat Intolerance, Hot flashes and New Diabetes. Hematology Not Present- Blood Thinners, Easy Bruising, Excessive bleeding, Gland problems, HIV and Persistent Infections.  Vitals Weight: 244.8 lb Height: 72in Body Surface Area: 2.32 m Body Mass Index: 33.2 kg/m  Temp.: 97.81F  Pulse: 69 (Regular)  BP: 132/80 (Sitting, Left Arm, Standard)    Physical Exam  General Mental Status-Alert. General Appearance-Consistent with stated age. Hydration-Well hydrated. Voice-Normal.  Head and Neck Head-normocephalic, atraumatic with no lesions or palpable masses. Trachea-midline. Thyroid Gland Characteristics - normal size and consistency.  Eye Eyeball - Bilateral-Extraocular movements intact. Sclera/Conjunctiva - Bilateral-No scleral icterus.  Chest and Lung Exam Chest and lung exam reveals -quiet, even and easy respiratory effort with no use of accessory muscles and on auscultation, normal breath sounds, no adventitious sounds and normal vocal resonance. Inspection Chest Wall - Normal. Back - normal.  Cardiovascular Cardiovascular examination reveals -normal heart sounds, regular rate and rhythm with no murmurs and normal pedal pulses bilaterally.  Abdomen Inspection Inspection of the abdomen reveals - No Hernias. Palpation/Percussion Palpation and Percussion of the abdomen reveal - Soft, Non Tender, No Rebound tenderness, No Rigidity (guarding) and No  hepatosplenomegaly. Auscultation Auscultation of the abdomen reveals - Bowel sounds normal.  Neurologic Neurologic evaluation reveals -alert and oriented x 3 with no impairment of recent or remote memory. Mental Status-Normal.  Musculoskeletal Global Assessment -Note: no gross deformities.  Normal Exam - Left-Upper Extremity Strength Normal and Lower Extremity Strength Normal. Normal Exam - Right-Upper Extremity Strength Normal and Lower Extremity Strength Normal.  Lymphatic Head & Neck  General Head & Neck Lymphatics: Bilateral - Description - Normal. Axillary  General Axillary Region: Bilateral - Description - Normal. Tenderness - Non Tender. Femoral & Inguinal  Generalized Femoral & Inguinal Lymphatics: Bilateral - Description - No Generalized lymphadenopathy.    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 It appears to be amenable to minimally invasive resection. I would plan a laparoscopic approach with a wedge resection. He would need to stay overnight at least. I discussed post op diet of full liquids/soft solids for around 1 week at home. I discussed risks of bleeding, infection, leak of stomach, recurrent mass, possible need for additional procedures or surgery, possible prolonged nausea and early satiety, possible heart or lung complications, blood clot or death. I have discussed recovery time and restrictions. He understands and wishes to proceed. Current Plans Schedule for Surgery Pt Education - [facility]   Signed by Stark Klein, MD

## 2017-08-14 NOTE — Op Note (Signed)
PRE-OPERATIVE DIAGNOSIS: gastric mass  POST-OPERATIVE DIAGNOSIS:  Same  PROCEDURE:  Procedure(s): Laparoscopic partial gastrectomy  SURGEON:  Surgeon(s): Stark Klein, MD  ASSISTANT:   Judyann Munson, RNFA  ANESTHESIA:   local and general  DRAINS: none   LOCAL MEDICATIONS USED:  BUPIVICAINE  and LIDOCAINE   SPECIMEN:  Source of Specimen:  stomach mass  DISPOSITION OF SPECIMEN:  PATHOLOGY  COUNTS:  YES  PLAN OF CARE: Admit to inpatient   PATIENT DISPOSITION:  PACU - hemodynamically stable.   FINDINGS:  Rubbery mass in mid stomach approximately 2 cm.  PROCEDURE:  Pt was identified in the holding area and taken to the operating room and placed supine on the operating room table.  General anesthesia was induced.  A foley catheter was placed.  The abdomen was prepped and draped in sterile fashion.  Timeout was performed according to the surgical safety checklist.  When all was correct, we continued.    The patient was placed in reverse Trendelenberg position and rotated to the left.  A 5 mm Optiview trocar was placed at the left costal margin under direct visualization.  Two 5 mm trocars were placed in the upper midline as well, and one in the left upper quadrant.  The short gastric arteries were taken down with the harmonic scalpel.  Care was taken to avoid the spleen.  The stomach was mobilized up the greater curve to the GE junction.  The mass was visible.  A 5 mm port was placed in the subxiphoid position and the articulating liver retractor was used to hold the left lateral segment up.  The supraumbilical port was upsized to a 12 mm port.  The 57 Fr lighted Bougie retractor was placed by Dr. Ola Spurr.  There was adequate diameter of the esophagus to staple off the mass.  Two fires of the Echelon stapler were used to divide the cardia of the stomach with the mass from the stomach.  There was no bleeding at the staple line.  The mass was retrieved with an endocatch bag via the  12 mm port.  This was examined to make sure the gross margin was negative.    The left upper quadrant was reexamined for hemostasis.  There was no evidence of bleeding.  A 4 quadrant inspection was performed and was negative for signs of succus or blood.  The 12 mm port was closed with a 0 vicryl figure of 8 suture.  There was no air leakage from the port.  The remaining ports were removed and pneumoperitoneum was allowed to evacuate.  The skin of all the incisions was closed with 4-0 Monocryl in subcuticular fashion.  The wounds were cleaned, dried, and dressed with dermabond. Needle, sponge, and instrument counts were correct.  The patient was awakened from anesthesia and taken to the PACU in stable condition.

## 2017-08-14 NOTE — Anesthesia Preprocedure Evaluation (Addendum)
Anesthesia Evaluation  Patient identified by MRN, date of birth, ID band Patient awake    Reviewed: Allergy & Precautions, NPO status , Patient's Chart, lab work & pertinent test results  History of Anesthesia Complications Negative for: history of anesthetic complications  Airway Mallampati: II  TM Distance: >3 FB Neck ROM: Full    Dental  (+) Dental Advisory Given, Caps   Pulmonary neg pulmonary ROS,    breath sounds clear to auscultation       Cardiovascular hypertension, Pt. on medications (-) angina Rhythm:Regular Rate:Normal     Neuro/Psych negative neurological ROS     GI/Hepatic Neg liver ROS, GERD  Medicated and Controlled,Gastric lymphoma   Endo/Other  negative endocrine ROS  Renal/GU negative Renal ROS     Musculoskeletal   Abdominal   Peds  Hematology negative hematology ROS (+)   Anesthesia Other Findings   Reproductive/Obstetrics                            Anesthesia Physical Anesthesia Plan  ASA: II  Anesthesia Plan: General   Post-op Pain Management:    Induction: Intravenous  PONV Risk Score and Plan: 3 and Dexamethasone, Midazolam, Scopolamine patch - Pre-op and Ondansetron  Airway Management Planned: Oral ETT  Additional Equipment:   Intra-op Plan:   Post-operative Plan: Extubation in OR  Informed Consent: I have reviewed the patients History and Physical, chart, labs and discussed the procedure including the risks, benefits and alternatives for the proposed anesthesia with the patient or authorized representative who has indicated his/her understanding and acceptance.   Dental advisory given  Plan Discussed with: CRNA and Surgeon  Anesthesia Plan Comments: (Plan routine monitors, GETA)        Anesthesia Quick Evaluation

## 2017-08-15 ENCOUNTER — Encounter (HOSPITAL_COMMUNITY): Payer: Self-pay | Admitting: General Surgery

## 2017-08-15 LAB — BASIC METABOLIC PANEL
ANION GAP: 5 (ref 5–15)
BUN: 11 mg/dL (ref 6–20)
CHLORIDE: 106 mmol/L (ref 101–111)
CO2: 24 mmol/L (ref 22–32)
CREATININE: 1.15 mg/dL (ref 0.61–1.24)
Calcium: 8.3 mg/dL — ABNORMAL LOW (ref 8.9–10.3)
GFR calc non Af Amer: >60 mL/min (ref >60)
Glucose, Bld: 165 mg/dL — ABNORMAL HIGH (ref 65–99)
POTASSIUM: 4.1 mmol/L (ref 3.5–5.1)
SODIUM: 135 mmol/L (ref 135–145)

## 2017-08-15 LAB — CBC
HEMATOCRIT: 43.2 % (ref 39.0–52.0)
HEMOGLOBIN: 14.6 g/dL (ref 13.0–17.0)
MCH: 32.7 pg (ref 26.0–34.0)
MCHC: 33.8 g/dL (ref 30.0–36.0)
MCV: 96.9 fL (ref 78.0–100.0)
Platelets: 181 10*3/uL (ref 150–400)
RBC: 4.46 MIL/uL (ref 4.22–5.81)
RDW: 13.3 % (ref 11.5–15.5)
WBC: 8.6 10*3/uL (ref 4.0–10.5)

## 2017-08-15 MED ORDER — PANTOPRAZOLE SODIUM 40 MG PO TBEC: 40.0000 mg | DELAYED_RELEASE_TABLET | Freq: Every day | ORAL | Status: DC

## 2017-08-15 MED ORDER — OXYCODONE HCL 5 MG PO TABS: 5.0000 mg | ORAL_TABLET | ORAL | 0 refills | Status: DC | PRN

## 2017-08-15 NOTE — Discharge Summary (Signed)
Physician Discharge Summary  Patient ID: Nicholas [name] MRN: [id number] DOB/AGE: June 06, 1956 61 y.o.  Admit date: 08/14/2017 Discharge date: 08/15/2017  Admission Diagnoses: Gastric mass  Discharge Diagnoses:  Active Problems:   Gastric mass  Discharged Condition: stable  Hospital Course:  Pt was admitted to the floor following a laparoscopic partial gastrectomy for a gastric mass.  He was placed on clear liquids overnight.  He had no nausea.  His pain was controlled with oral meds.  He was advanced to full liquids on POD1.  He had good mobility and was discharged home on full liquids.    Consults: None  Significant Diagnostic Studies: labs: see epic  Treatments: surgery: see above  Discharge Exam: Blood pressure 119/84, pulse 75, temperature 98.6 F (37 C), temperature source Oral, resp. rate 16, SpO2 96 %. General appearance: alert, cooperative and no distress Resp: breathing comfortably Cardio: regular rate and rhythm GI: soft, non tender, non distended.  minimal bruising at incisions. Extremities: extremities normal, atraumatic, no cyanosis or edema  Disposition: 01-Home or Self Care  Discharge Instructions    Call MD for:  difficulty breathing, headache or visual disturbances   Complete by:  As directed    Call MD for:  persistant nausea and vomiting   Complete by:  As directed    Call MD for:  redness, tenderness, or signs of infection (pain, swelling, redness, odor or green/yellow discharge around incision site)   Complete by:  As directed    Call MD for:  severe uncontrolled pain   Complete by:  As directed    Call MD for:  temperature >100.4   Complete by:  As directed    Discharge diet:   Complete by:  As directed    Full liquids.  Ok to start soft solids Saturday like mashed potatoes, scrambled eggs, etc.   Increase activity slowly   Complete by:  As directed      Allergies as of 08/15/2017      Reactions   Penicillins    Has patient had a PCN reaction  causing immediate rash, facial/tongue/throat swelling, SOB or lightheadedness with hypotension: unknown Has patient had a PCN reaction causing severe rash involving mucus membranes or skin necrosis: Unknown Has patient had a PCN reaction that required hospitalization: Yes Has patient had a PCN reaction occurring within the last 10 years: No If all of the above answers are "NO", then may proceed with Cephalosporin use. "childhood reaction"      Medication List    TAKE these medications   acetaminophen 325 MG tablet Commonly known as:  TYLENOL Take 2 tablets (650 mg total) by mouth every 4 (four) hours as needed. What changed:  reasons to take this   lisinopril-hydrochlorothiazide 20-12.5 MG tablet Commonly known as:  PRINZIDE,ZESTORETIC Take 1 tablet by mouth every evening.   ondansetron 4 MG tablet Commonly known as:  ZOFRAN Take 1 tablet (4 mg total) by mouth every 6 (six) hours as needed for nausea.   oxyCODONE 5 MG immediate release tablet Commonly known as:  Oxy IR/ROXICODONE Take 1-2 tablets (5-10 mg total) every 4 (four) hours as needed by mouth for moderate pain.   pantoprazole 40 MG tablet Commonly known as:  PROTONIX Take 1 tablet (40 mg total) by mouth 2 (two) times daily.   traMADol 50 MG tablet Commonly known as:  ULTRAM Take 1 tablet (50 mg total) by mouth every 6 (six) hours as needed. What changed:  reasons to take this   Vitamin D3  5000 units Caps Take 5,000 Units by mouth daily.      Follow-up Information    Stark Klein, MD Follow up in 2 week(s).   Specialty:  General Surgery Contact information: [address] [place] [address] [phone]           Signed: Stark Klein 08/15/2017, 9:16 AM

## 2017-08-15 NOTE — Progress Notes (Signed)
Discharge instruction and education reviewed with patient.  Pt had not question.  Pt signed discharge paperwork.

## 2017-08-20 NOTE — Progress Notes (Signed)
Please let patient know pathology is benign.

## 2018-04-29 DIAGNOSIS — L237 Allergic contact dermatitis due to plants, except food: Secondary | ICD-10-CM | POA: Diagnosis not present

## 2018-04-29 DIAGNOSIS — Z6835 Body mass index (BMI) 35.0-35.9, adult: Secondary | ICD-10-CM | POA: Diagnosis not present

## 2018-04-29 DIAGNOSIS — I10 Essential (primary) hypertension: Secondary | ICD-10-CM | POA: Diagnosis not present

## 2018-08-26 DIAGNOSIS — R82998 Other abnormal findings in urine: Secondary | ICD-10-CM | POA: Diagnosis not present

## 2018-08-26 DIAGNOSIS — I10 Essential (primary) hypertension: Secondary | ICD-10-CM | POA: Diagnosis not present

## 2018-08-26 DIAGNOSIS — Z0000 Encounter for general adult medical examination without abnormal findings: Secondary | ICD-10-CM | POA: Diagnosis not present

## 2018-08-26 DIAGNOSIS — E559 Vitamin D deficiency, unspecified: Secondary | ICD-10-CM | POA: Diagnosis not present

## 2018-08-26 DIAGNOSIS — Z125 Encounter for screening for malignant neoplasm of prostate: Secondary | ICD-10-CM | POA: Diagnosis not present

## 2018-08-28 DIAGNOSIS — Z1212 Encounter for screening for malignant neoplasm of rectum: Secondary | ICD-10-CM | POA: Diagnosis not present

## 2018-09-02 DIAGNOSIS — I10 Essential (primary) hypertension: Secondary | ICD-10-CM | POA: Diagnosis not present

## 2018-09-02 DIAGNOSIS — J328 Other chronic sinusitis: Secondary | ICD-10-CM | POA: Diagnosis not present

## 2018-09-02 DIAGNOSIS — E559 Vitamin D deficiency, unspecified: Secondary | ICD-10-CM | POA: Diagnosis not present

## 2018-09-02 DIAGNOSIS — Z0000 Encounter for general adult medical examination without abnormal findings: Secondary | ICD-10-CM | POA: Diagnosis not present

## 2018-09-02 DIAGNOSIS — Z1389 Encounter for screening for other disorder: Secondary | ICD-10-CM | POA: Diagnosis not present

## 2018-11-21 DIAGNOSIS — K219 Gastro-esophageal reflux disease without esophagitis: Secondary | ICD-10-CM | POA: Diagnosis not present

## 2018-11-21 DIAGNOSIS — Z6835 Body mass index (BMI) 35.0-35.9, adult: Secondary | ICD-10-CM | POA: Diagnosis not present

## 2018-11-21 DIAGNOSIS — R05 Cough: Secondary | ICD-10-CM | POA: Diagnosis not present

## 2018-11-21 DIAGNOSIS — I10 Essential (primary) hypertension: Secondary | ICD-10-CM | POA: Diagnosis not present

## 2018-12-23 ENCOUNTER — Encounter: Payer: Self-pay | Admitting: Gastroenterology

## 2019-07-24 DIAGNOSIS — Z23 Encounter for immunization: Secondary | ICD-10-CM | POA: Diagnosis not present

## 2019-09-07 DIAGNOSIS — R82998 Other abnormal findings in urine: Secondary | ICD-10-CM | POA: Diagnosis not present

## 2019-09-07 DIAGNOSIS — Z125 Encounter for screening for malignant neoplasm of prostate: Secondary | ICD-10-CM | POA: Diagnosis not present

## 2019-09-07 DIAGNOSIS — Z0000 Encounter for general adult medical examination without abnormal findings: Secondary | ICD-10-CM | POA: Diagnosis not present

## 2019-09-07 DIAGNOSIS — E559 Vitamin D deficiency, unspecified: Secondary | ICD-10-CM | POA: Diagnosis not present

## 2019-09-07 DIAGNOSIS — I10 Essential (primary) hypertension: Secondary | ICD-10-CM | POA: Diagnosis not present

## 2019-09-11 DIAGNOSIS — Z1331 Encounter for screening for depression: Secondary | ICD-10-CM | POA: Diagnosis not present

## 2019-09-11 DIAGNOSIS — Z0000 Encounter for general adult medical examination without abnormal findings: Secondary | ICD-10-CM | POA: Diagnosis not present

## 2019-09-11 DIAGNOSIS — I10 Essential (primary) hypertension: Secondary | ICD-10-CM | POA: Diagnosis not present

## 2019-09-11 DIAGNOSIS — E559 Vitamin D deficiency, unspecified: Secondary | ICD-10-CM | POA: Diagnosis not present

## 2019-09-11 DIAGNOSIS — K2210 Ulcer of esophagus without bleeding: Secondary | ICD-10-CM | POA: Diagnosis not present

## 2019-09-11 DIAGNOSIS — K219 Gastro-esophageal reflux disease without esophagitis: Secondary | ICD-10-CM | POA: Diagnosis not present

## 2019-09-29 DIAGNOSIS — Z1212 Encounter for screening for malignant neoplasm of rectum: Secondary | ICD-10-CM | POA: Diagnosis not present

## 2019-10-06 DIAGNOSIS — Z1211 Encounter for screening for malignant neoplasm of colon: Secondary | ICD-10-CM | POA: Diagnosis not present

## 2019-11-20 DIAGNOSIS — Z1211 Encounter for screening for malignant neoplasm of colon: Secondary | ICD-10-CM | POA: Diagnosis not present

## 2020-01-07 DIAGNOSIS — Z23 Encounter for immunization: Secondary | ICD-10-CM | POA: Diagnosis not present

## 2020-01-13 DIAGNOSIS — Z20822 Contact with and (suspected) exposure to covid-19: Secondary | ICD-10-CM | POA: Diagnosis not present

## 2020-02-05 DIAGNOSIS — C4401 Basal cell carcinoma of skin of lip: Secondary | ICD-10-CM | POA: Diagnosis not present

## 2020-02-05 DIAGNOSIS — C44319 Basal cell carcinoma of skin of other parts of face: Secondary | ICD-10-CM | POA: Diagnosis not present

## 2020-02-05 DIAGNOSIS — L738 Other specified follicular disorders: Secondary | ICD-10-CM | POA: Diagnosis not present

## 2020-02-06 DIAGNOSIS — L03031 Cellulitis of right toe: Secondary | ICD-10-CM | POA: Diagnosis not present

## 2020-02-06 DIAGNOSIS — M79674 Pain in right toe(s): Secondary | ICD-10-CM | POA: Diagnosis not present

## 2020-02-09 DIAGNOSIS — M109 Gout, unspecified: Secondary | ICD-10-CM | POA: Diagnosis not present

## 2020-02-24 DIAGNOSIS — C4401 Basal cell carcinoma of skin of lip: Secondary | ICD-10-CM | POA: Diagnosis not present

## 2020-04-20 DIAGNOSIS — M109 Gout, unspecified: Secondary | ICD-10-CM | POA: Diagnosis not present

## 2020-07-25 ENCOUNTER — Institutional Professional Consult (permissible substitution): Payer: Self-pay | Admitting: Neurology

## 2020-08-04 ENCOUNTER — Encounter: Payer: Self-pay | Admitting: Neurology

## 2020-08-04 ENCOUNTER — Ambulatory Visit: Payer: BC Managed Care – PPO | Admitting: Neurology

## 2020-08-04 VITALS — BP 115/73 | HR 67 | Ht 72.0 in | Wt 254.0 lb

## 2020-08-04 DIAGNOSIS — G478 Other sleep disorders: Secondary | ICD-10-CM

## 2020-08-04 DIAGNOSIS — R0683 Snoring: Secondary | ICD-10-CM | POA: Diagnosis not present

## 2020-08-04 DIAGNOSIS — E669 Obesity, unspecified: Secondary | ICD-10-CM | POA: Diagnosis not present

## 2020-08-04 DIAGNOSIS — G4700 Insomnia, unspecified: Secondary | ICD-10-CM

## 2020-08-04 DIAGNOSIS — R351 Nocturia: Secondary | ICD-10-CM

## 2020-08-04 DIAGNOSIS — J342 Deviated nasal septum: Secondary | ICD-10-CM

## 2020-08-04 NOTE — Progress Notes (Signed)
Subjective:    Patient ID: Nicholas [name] is a 64 y.o. male.  HPI     Star Age, MD, PhD [facility] [address] [place], [place] [address]  Dear Dr. Ardeth Perfect,   I saw your patient, Nicholas [name], upon your kind request, in my Sleep clinic today for initial consultation of his sleep disorder, in particular, concern for underlying obstructive sleep apnea.  The patient is unaccompanied today.  As you know, Nicholas [name] is a 64 year old right-handed gentleman with an underlying medical history of hypertension, gout, reflux disease, and mild obesity, who reports snoring and nonrestorative sleep, sleep disruption, particularly difficulty maintaining sleep.  His symptoms have been worse over the past 2 to 3 months.  Some years ago he tried Ambien but did not want to stay on it long-term.  He tries melatonin off and on 10 mg strength.  It helps a little bit.  He does snore and snoring is better when he uses a Breathe Right strip.  His Epworth sleepiness score is 7 out of 24, fatigue severity score is 47 out of 63.  He lives with his girlfriend, he had some report of witnessed apneas in the past but not lately.  He does not wake up with a headache and has nocturia about once per average night.  He goes to bed around 1030 and rise time is between 530 and 545 because his girlfriend has to get up early.  He went to the office around 8 AM.  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  He is a non-smoker and drinks alcohol occasionally in the form of beer, up to 3 times a week, caffeine in the form of tea, typically 1 serving in the morning, occasional soda in the afternoon.  He has 1 grown daughter and she has 1 daughter in college.  They do have a TV in the bedroom but typically do not watch it at night and he likes to read before falling asleep and sometimes he has also read in the middle of the night when he can sleep but does not do that any longer.  He had a  tonsillectomy at age 71.  He is not aware of any family history of sleep apnea. I reviewed your office note from 09/11/2019 which was his physical. He has gained some weight in the past few months, in the realm of 10 pounds.  He exercises on a regular basis, particularly riding his bike.  His Past Medical History Is Significant For: Past Medical History:  Diagnosis Date  . Hypertension     His Past Surgical History Is Significant For: Past Surgical History:  Procedure Laterality Date  . colonscopy    . ESOPHAGOGASTRODUODENOSCOPY (EGD) WITH PROPOFOL N/A 03/02/2017   Procedure: ESOPHAGOGASTRODUODENOSCOPY (EGD);  Surgeon: [name], MD;  Location: [facility];  Service: Gastroenterology;  Laterality: N/A;  . EUS N/A 03/21/2017   Procedure: UPPER ENDOSCOPIC ULTRASOUND (EUS) LINEAR;  Surgeon: [name], MD;  Location: [facility];  Service: Endoscopy;  Laterality: N/A;  . INCISION AND DRAINAGE  2015   left thigh  . LAPAROSCOPIC PARTIAL GASTRECTOMY N/A 08/14/2017   Procedure: LAPAROSCOPIC PARTIAL GASTRECTOMY;  Surgeon: Stark Klein, MD;  Location: [facility];  Service: General;  Laterality: N/A;  . TONSILLECTOMY      His Family History Is Significant For: Family History  Problem Relation Age of Onset  . Pancreatic cancer Mother   . Other Father        "  old age"    His Social History Is Significant For: Social History   Socioeconomic History  . Marital status: Divorced    Spouse name: Not on file  . Number of children: 1  . Years of education: college  . Highest education level: Not on file  Occupational History  . Occupation: Armed forces operational officer  Tobacco Use  . Smoking status: Never Smoker  . Smokeless tobacco: Never Used  Vaping Use  . Vaping Use: Never used  Substance and Sexual Activity  . Alcohol use: Yes    Comment: 2-3 beers per week  . Drug use: Never  . Sexual activity: Not on file  Other Topics Concern  . Not on file  Social History Narrative    Lives at with his family.   1-2 glasses of iced tea.   Right-handed.   Social Determinants of Health   Financial Resource Strain:   . Difficulty of Paying Living Expenses: Not on file  Food Insecurity:   . Worried About Charity fundraiser in the Last Year: Not on file  . Ran Out of Food in the Last Year: Not on file  Transportation Needs:   . Lack of Transportation (Medical): Not on file  . Lack of Transportation (Non-Medical): Not on file  Physical Activity:   . Days of Exercise per Week: Not on file  . Minutes of Exercise per Session: Not on file  Stress:   . Feeling of Stress : Not on file  Social Connections:   . Frequency of Communication with Friends and Family: Not on file  . Frequency of Social Gatherings with Friends and Family: Not on file  . Attends Religious Services: Not on file  . Active Member of Clubs or Organizations: Not on file  . Attends Archivist Meetings: Not on file  . Marital Status: Not on file    His Allergies Are:  Allergies  Allergen Reactions  . Penicillins     Has patient had a PCN reaction causing immediate rash, facial/tongue/throat swelling, SOB or lightheadedness with hypotension: unknown Has patient had a PCN reaction causing severe rash involving mucus membranes or skin necrosis: Unknown Has patient had a PCN reaction that required hospitalization: Yes Has patient had a PCN reaction occurring within the last 10 years: No If all of the above answers are "NO", then may proceed with Cephalosporin use. "childhood reaction"  :   His Current Medications Are:  Outpatient Encounter Medications as of 08/04/2020  Medication Sig  . allopurinol (ZYLOPRIM) 100 MG tablet Take 100 mg by mouth daily.  Marland Kitchen lisinopril-hydrochlorothiazide (PRINZIDE,ZESTORETIC) 20-12.5 MG tablet Take 1 tablet by mouth every evening.  . pantoprazole (PROTONIX) 40 MG tablet Take 1 tablet (40 mg total) by mouth 2 (two) times daily.  . [DISCONTINUED] Cholecalciferol  (VITAMIN D3) 5000 units CAPS Take 5,000 Units by mouth daily.  . [DISCONTINUED] ondansetron (ZOFRAN) 4 MG tablet Take 1 tablet (4 mg total) by mouth every 6 (six) hours as needed for nausea.  . [DISCONTINUED] oxyCODONE (OXY IR/ROXICODONE) 5 MG immediate release tablet Take 1-2 tablets (5-10 mg total) every 4 (four) hours as needed by mouth for moderate pain.  . [DISCONTINUED] traMADol (ULTRAM) 50 MG tablet Take 1 tablet (50 mg total) by mouth every 6 (six) hours as needed. (Patient taking differently: Take 50 mg by mouth every 6 (six) hours as needed for moderate pain. )   No facility-administered encounter medications on file as of 08/04/2020.  :  Review of Systems:  Out of a complete 14 point review of systems, all are reviewed and negative with the exception of these symptoms as listed below:  Review of Systems  Neurological:       Reports snoring and waking up during the night with difficulty falling back to sleep. Says the most he is able to sleep is six hours per night. No prior sleep study.  Epworth Sleepiness Scale 0= would never doze 1= slight chance of dozing 2= moderate chance of dozing 3= high chance of dozing  Sitting and reading: 1 Watching TV: 1 Sitting inactive in a public place (ex. Theater or meeting): 1 As a passenger in a car for an hour without a break: 0 Lying down to rest in the afternoon: 2 Sitting and talking to someone: 0 Sitting quietly after lunch (no alcohol): 2 In a car, while stopped in traffic:0 Total:    7/24 Objective:  Neurological Exam  Physical Exam Physical Examination:   Vitals:   08/04/20 0742  BP: 115/73  Pulse: 67    General Examination: The patient is a very pleasant 64 y.o. male in no acute distress. He appears well-developed and well-nourished and well groomed.   HEENT: Normocephalic, atraumatic, pupils are equal, round and reactive to light, extraocular tracking is good without limitation to gaze excursion or nystagmus noted.  Hearing is grossly intact. Face is symmetric with normal facial animation. Speech is clear with no dysarthria noted. There is no hypophonia. There is no lip, neck/head, jaw or voice tremor. Neck is supple with full range of passive and active motion. There are no carotid bruits on auscultation. Oropharynx exam reveals: mild mouth dryness, adequate dental hygiene and mild to moderate airway crowding, due to small airway entry and redundant soft palate, slightly larger appearing uvula.  Tonsils absent, Mallampati class II, neck circumference of 17-5/8 inches.  He has a minimal overbite.  Nasal inspection shows narrow nasal passages, deviated septum to the right.   Chest: Clear to auscultation without wheezing, rhonchi or crackles noted.  Heart: S1+S2+0, regular and normal without murmurs, rubs or gallops noted.   Abdomen: Soft, non-tender and non-distended with normal bowel sounds appreciated on auscultation.  Extremities: There is no edema in the distal lower extremities bilaterally.   Skin: Warm and dry without trophic changes noted.   Musculoskeletal: exam reveals no obvious joint deformities, tenderness or joint swelling or erythema.   Neurologically:  Mental status: The patient is awake, alert and oriented in all 4 spheres. His immediate and remote memory, attention, language skills and fund of knowledge are appropriate. There is no evidence of aphasia, agnosia, apraxia or anomia. Speech is clear with normal prosody and enunciation. Thought process is linear. Mood is normal and affect is normal.  Cranial nerves II - XII are as described above under HEENT exam.  Motor exam: Normal bulk, strength and tone is noted. There is no tremor, Romberg is negative. Fine motor skills and coordination: grossly intact.  Cerebellar testing: No dysmetria or intention tremor. There is no truncal or gait ataxia.  Sensory exam: intact to light touch in the upper and lower extremities.  Gait, station and balance:  He stands easily. No veering to one side is noted. No leaning to one side is noted. Posture is age-appropriate and stance is narrow based. Gait shows normal stride length and normal pace. No problems turning are noted. Tandem walk is unremarkable.                Assessment and Plan:  In summary, Nicholas [name] is a very pleasant 64 y.o.-year old male with an underlying medical history of hypertension, gout, reflux disease, and mild obesity, whose history and physical exam are concerning for obstructive sleep apnea (OSA). I had a long chat with the patient about my findings and the diagnosis of OSA, its prognosis and treatment options. We talked about medical treatments, surgical interventions and non-pharmacological approaches. I explained in particular the risks and ramifications of untreated moderate to severe OSA, especially with respect to developing cardiovascular disease down the Road, including congestive heart failure, difficult to treat hypertension, cardiac arrhythmias, or stroke. Even type 2 diabetes has, in part, been linked to untreated OSA. Symptoms of untreated OSA include daytime sleepiness, memory problems, mood irritability and mood disorder such as depression and anxiety, lack of energy, as well as recurrent headaches, especially morning headaches. We talked about trying to maintain a healthy lifestyle in general, as well as the importance of weight control. We also talked about the importance of good sleep hygiene.  We talked about natural supplements to help with sleep including valerian and chamomile as well as melatonin. I recommended the following at this time: sleep study.  I explained the sleep test procedure to the patient and also outlined possible surgical and non-surgical treatment options of OSA, including the use of a custom-made dental device (which would require a referral to a specialist dentist or oral surgeon), upper airway surgical options, such as traditional UPPP or a  novel less invasive surgical option in the form of Inspire hypoglossal nerve stimulation (which would involve a referral to an ENT surgeon). I also explained the CPAP treatment option to the patient, who indicated that he would be willing to try CPAP if the need arises. I explained the importance of being compliant with PAP treatment, not only for insurance purposes but primarily to improve His symptoms, and for the patient's long term health benefit, including to reduce His cardiovascular risks. I answered all his questions today and the patient was in agreement. I plan to see him back after the sleep study is completed and encouraged him to call with any interim questions, concerns, problems or updates.   Thank you very much for allowing me to participate in the care of this nice patient. If I can be of any further assistance to you please do not hesitate to call me at [phone].  Sincerely,   Star Age, MD, PhD

## 2020-08-04 NOTE — Patient Instructions (Signed)

## 2020-08-24 ENCOUNTER — Ambulatory Visit (INDEPENDENT_AMBULATORY_CARE_PROVIDER_SITE_OTHER): Payer: BC Managed Care – PPO | Admitting: Neurology

## 2020-08-24 DIAGNOSIS — G4700 Insomnia, unspecified: Secondary | ICD-10-CM

## 2020-08-24 DIAGNOSIS — G4733 Obstructive sleep apnea (adult) (pediatric): Secondary | ICD-10-CM | POA: Diagnosis not present

## 2020-08-24 DIAGNOSIS — E669 Obesity, unspecified: Secondary | ICD-10-CM

## 2020-08-24 DIAGNOSIS — R0683 Snoring: Secondary | ICD-10-CM

## 2020-08-24 DIAGNOSIS — G478 Other sleep disorders: Secondary | ICD-10-CM

## 2020-08-24 DIAGNOSIS — R351 Nocturia: Secondary | ICD-10-CM

## 2020-08-24 DIAGNOSIS — J342 Deviated nasal septum: Secondary | ICD-10-CM

## 2020-08-28 NOTE — Progress Notes (Signed)
Patient referred by Dr. Ardeth Perfect, seen by me on 08/04/20, HST on 08/24/20    Please call and notify the patient that the recent home sleep test showed obstructive sleep apnea in the severe range. I recommend treatment in the form of autoPAP, which means, that we don't have to bring her in for a sleep study with CPAP, but will let her start an autoPAP machine at home, through a [facility] (of her choice, or as per insurance requirement). The DME representative will educate her on how to use the machine, how to put the mask on, etc. I have placed an order in the chart. Please send referral, talk to patient, send report to referring MD. We will need a FU in sleep clinic for 10 weeks post-PAP set up, please arrange that with me or one of our NPs. Thanks,   Star Age, MD, PhD [facility])

## 2020-08-28 NOTE — Procedures (Signed)
   [facility]  HOME SLEEP TEST (Watch PAT)  STUDY DATE: 08/24/20  DOB: [birth year]  MRN: [id number]  ORDERING CLINICIAN: Star Age, MD, PhD   REFERRING CLINICIAN: Velna Hatchet, MD   CLINICAL INFORMATION/HISTORY:  64 year old man with a history of hypertension, gout, reflux disease, and mild obesity, who reports snoring, nonrestorative sleep, sleep disruption, particularly difficulty maintaining sleep.   Epworth sleepiness score: 7/24.  BMI: 34.3 kg/m  FINDINGS:   Total Record Time (hours, min): 7 H 41 min  Total Sleep Time (hours, min):  6 H 7 min   Percent REM (%):    N/A   Calculated pAHI (per hour):  43.7     REM pAHI:    N/A     NREM pAHI: N/A   Oxygen Saturation (%) Mean: 92 Minimum oxygen saturation (%):        83  O2 Saturation Range (%): 94-63 O2Saturation (minutes) <=88%: 5.3   Pulse Mean (bpm):    63 Pulse Range (94-43)   IMPRESSION: OSA (obstructive sleep apnea)   RECOMMENDATION:  This home sleep test demonstrates severe obstructive sleep apnea with a total AHI of 43.7/hour and O2 nadir of 83%. Treatment with positive airway pressure is recommended. The patient will be advised to proceed with an autoPAP titration/trial at home for now. A full night titration study may be considered to optimize treatment settings, if needed down the road. Please note that untreated obstructive sleep apnea may carry additional perioperative morbidity. Patients with significant obstructive sleep apnea should receive perioperative PAP therapy and the surgeons and particularly the anesthesiologist should be informed of the diagnosis and the severity of the sleep disordered breathing. The patient should be cautioned not to drive, work at heights, or operate dangerous or heavy equipment when tired or sleepy. Review and reiteration of good sleep hygiene measures should be pursued with any patient. Other causes of the patient's symptoms, including circadian rhythm  disturbances, an underlying mood disorder, medication effect and/or an underlying medical problem cannot be ruled out based on this test. Clinical correlation is recommended. The patient and his referring provider will be notified of the test results. The patient will be seen in follow up in sleep clinic at [facility].  I certify that I have reviewed the raw data recording prior to the issuance of this report in accordance with the standards of the American Academy of Sleep Medicine (AASM).  INTERPRETING PHYSICIAN:   Star Age, MD, PhD  Board Certified in Neurology and Sleep Medicine  [facility] [address] [place], [place] [address] [phone]

## 2020-08-28 NOTE — Addendum Note (Signed)
Addended by: Star Age on: 08/28/2020 09:40 PM   Modules accepted: Orders

## 2020-08-29 ENCOUNTER — Telehealth: Payer: Self-pay

## 2020-08-29 NOTE — Telephone Encounter (Signed)
I called pt. I advised pt that Dr. Rexene Alberts reviewed their sleep study results and found that pt has severe osa. Dr. Rexene Alberts recommends that pt start an auto pap at home. I reviewed PAP compliance expectations with the pt. Pt is agreeable to starting an auto-PAP. I advised pt that an order will be sent to a DME, [facility], and [facility] will call the pt within about one week after they file with the pt's insurance. [facility] will show the pt how to use the machine, fit for masks, and troubleshoot the auto-PAP if needed. A follow up appt was made for insurance purposes with Dr. Rexene Alberts on 11/10/2020 at 7:30am with Dr. Rexene Alberts. Pt verbalized understanding to arrive 15 minutes early and bring their auto-PAP. A letter with all of this information in it will be mailed to the pt as a reminder. I verified with the pt that the address we have on file is correct. Pt verbalized understanding of results. Pt had no questions at this time but was encouraged to call back if questions arise. I have sent the order to [facility] and have received confirmation that they have received the order.

## 2020-08-29 NOTE — Telephone Encounter (Signed)
-----   Message from Star Age, MD sent at 08/28/2020  9:40 PM EST ----- Patient referred by Dr. Ardeth Perfect, seen by me on 08/04/20, HST on 08/24/20    Please call and notify the patient that the recent home sleep test showed obstructive sleep apnea in the severe range. I recommend treatment in the form of autoPAP, which means, that we don't have to bring her in for a sleep study with CPAP, but will let her start an autoPAP machine at home, through a [facility] (of her choice, or as per insurance requirement). The DME representative will educate her on how to use the machine, how to put the mask on, etc. I have placed an order in the chart. Please send referral, talk to patient, send report to referring MD. We will need a FU in sleep clinic for 10 weeks post-PAP set up, please arrange that with me or one of our NPs. Thanks,   Star Age, MD, PhD [facility])

## 2020-09-09 DIAGNOSIS — E559 Vitamin D deficiency, unspecified: Secondary | ICD-10-CM | POA: Diagnosis not present

## 2020-09-09 DIAGNOSIS — M109 Gout, unspecified: Secondary | ICD-10-CM | POA: Diagnosis not present

## 2020-09-09 DIAGNOSIS — Z125 Encounter for screening for malignant neoplasm of prostate: Secondary | ICD-10-CM | POA: Diagnosis not present

## 2020-09-09 DIAGNOSIS — Z0000 Encounter for general adult medical examination without abnormal findings: Secondary | ICD-10-CM | POA: Diagnosis not present

## 2020-09-09 DIAGNOSIS — I10 Essential (primary) hypertension: Secondary | ICD-10-CM | POA: Diagnosis not present

## 2020-09-16 DIAGNOSIS — Z0000 Encounter for general adult medical examination without abnormal findings: Secondary | ICD-10-CM | POA: Diagnosis not present

## 2020-09-16 DIAGNOSIS — R82998 Other abnormal findings in urine: Secondary | ICD-10-CM | POA: Diagnosis not present

## 2020-09-16 DIAGNOSIS — G4733 Obstructive sleep apnea (adult) (pediatric): Secondary | ICD-10-CM | POA: Diagnosis not present

## 2020-09-16 DIAGNOSIS — Z1212 Encounter for screening for malignant neoplasm of rectum: Secondary | ICD-10-CM | POA: Diagnosis not present

## 2020-09-16 DIAGNOSIS — I10 Essential (primary) hypertension: Secondary | ICD-10-CM | POA: Diagnosis not present

## 2020-09-16 DIAGNOSIS — M25561 Pain in right knee: Secondary | ICD-10-CM | POA: Diagnosis not present

## 2020-09-16 DIAGNOSIS — R7302 Impaired glucose tolerance (oral): Secondary | ICD-10-CM | POA: Diagnosis not present

## 2020-11-08 ENCOUNTER — Telehealth: Payer: Self-pay | Admitting: Neurology

## 2020-11-08 NOTE — Telephone Encounter (Signed)
I called the pt. He sts he has not received a start date for his cpap yet.  I apologized for this inconvenience, pt was advised there is a Consulting civil engineer and start dates on new machines are backed up.  I advised pt I would send a message to [facility] asking the to call the pt and give an estimated start date if possible.   Message for pt sent via fax, confirmation from DME was received. Pt advised he could call back if he has any other questions, but I advised we could not help speed this process along unfortunately.

## 2020-11-08 NOTE — Telephone Encounter (Signed)
Pt. hasn't received CPAP machine. Please advise for appt. Thurs.

## 2020-11-10 ENCOUNTER — Ambulatory Visit: Payer: Self-pay | Admitting: Neurology

## 2020-12-12 NOTE — Telephone Encounter (Signed)
I called pt to see if he has received a start date for his machine. He sts he has received no call/feedback from [facility]. Pt sts he will call [facility] to see where they are at in this process.   Pt will update me on what they say.  I spoke with Ivin Booty earlier and was advised several machines were sent to their office, just not sure where this pt falls on their set updates.

## 2020-12-19 DIAGNOSIS — G4733 Obstructive sleep apnea (adult) (pediatric): Secondary | ICD-10-CM | POA: Diagnosis not present

## 2020-12-20 ENCOUNTER — Telehealth: Payer: Self-pay

## 2020-12-20 NOTE — Telephone Encounter (Signed)
I called the pt and left vm advising I had received notice from [facility] that sts pt started pap on 12/19/20. Pt was advised to call back and schedule 31-90 day f/u. Please assist with scheduling once pt's has called back.

## 2021-01-19 DIAGNOSIS — G4733 Obstructive sleep apnea (adult) (pediatric): Secondary | ICD-10-CM | POA: Diagnosis not present

## 2021-02-01 ENCOUNTER — Other Ambulatory Visit: Payer: Self-pay

## 2021-02-01 ENCOUNTER — Ambulatory Visit: Payer: BC Managed Care – PPO | Admitting: Neurology

## 2021-02-01 ENCOUNTER — Telehealth: Payer: Self-pay

## 2021-02-01 ENCOUNTER — Encounter: Payer: Self-pay | Admitting: Neurology

## 2021-02-01 VITALS — BP 148/89 | HR 49 | Ht 72.0 in | Wt 256.0 lb

## 2021-02-01 DIAGNOSIS — G4733 Obstructive sleep apnea (adult) (pediatric): Secondary | ICD-10-CM | POA: Diagnosis not present

## 2021-02-01 DIAGNOSIS — Z789 Other specified health status: Secondary | ICD-10-CM

## 2021-02-01 DIAGNOSIS — Z9989 Dependence on other enabling machines and devices: Secondary | ICD-10-CM

## 2021-02-01 DIAGNOSIS — J342 Deviated nasal septum: Secondary | ICD-10-CM

## 2021-02-01 DIAGNOSIS — R0981 Nasal congestion: Secondary | ICD-10-CM | POA: Diagnosis not present

## 2021-02-01 NOTE — Progress Notes (Signed)
Subjective:    Patient ID: Nicholas [name] is a 65 y.o. male.  HPI     Interim history:   Nicholas [name] is a 65 year old right-handed gentleman with an underlying medical history of hypertension, gout, reflux disease, and mild obesity, who presents for follow-up on completion of Nicholas [name] sleep disturbance, after interim home sleep testing.  The patient is unaccompanied today.  I first met him at the request of Nicholas [name] primary care physician on 08/04/2020, at which time Nicholas [name] reported snoring, nonrestorative sleep, difficulty maintaining sleep.  Nicholas [name] was advised to proceed with a sleep study.  Nicholas [name] had a home sleep test on 08/24/2020 which indicated severe obstructive sleep apnea with an AHI of 43.7/h, O2 nadir 83%.  Nicholas [name] was advised to start AutoPap therapy.  Set up date was 12/19/2020.  Today, 02/01/2021: I reviewed Nicholas [name] AutoPap compliance data from 01/01/2021 through 01/30/2021, which is a total of 30 days, during which time Nicholas [name] used Nicholas [name] machine every night with percent use days greater than 4 hours at 40% only, indicating suboptimal compliance, average usage of 3 hours and 35 minutes, residual AHI borderline at 4.8/h, average pressure for the 95th percentile at 9.1 cm with a range of 7 to 14 cm, leak acceptable with a 95th percentile at 15.3 L/min.  In the past 43 days, Nicholas [name] has been 100% compliant with regards to using Nicholas [name] machine back percent use days greater than 4 hours was 37% only, average usage of 3 hours and 35 minutes.  Nicholas [name] reports difficulty tolerating the pressure as well as the nasal mask.  Nicholas [name] is struggling with nasal congestion which becomes worse as the night progresses.  Nicholas [name] has been using the nasal spray, likely generic Afrin, which helps in the beginning but then Nicholas [name] has to use it in the middle of the night.  Sometimes Nicholas [name] just pulls the mask off and tries to sleep without it.  Nicholas [name] is willing to continue to use it.  Nicholas [name] is motivated to get treated for Nicholas [name] sleep apnea and also agreeable to seeking evaluation with ENT.  Nicholas [name]  has noticed allergy symptoms including runny nose and has not tried any over-the-counter allergy medicine.  Nicholas [name] does use nasal Breathe Right strips to help with the nasal congestion which is marginally helpful.  Nicholas [name] is using a ResMed N 30i nasal mask.   The patient's allergies, current medications, family history, past medical history, past social history, past surgical history and problem list were reviewed and updated as appropriate.   Previously:   08/04/20: (Nicholas [name]) reports snoring and nonrestorative sleep, sleep disruption, particularly difficulty maintaining sleep.  Nicholas [name] symptoms have been worse over the past 2 to 3 months.  Some years ago Nicholas [name] tried Ambien but did not want to stay on it long-term.  Nicholas [name] tries melatonin off and on 10 mg strength.  It helps a little bit.  Nicholas [name] does snore and snoring is better when Nicholas [name] uses a Breathe Right strip.  Nicholas [name] Epworth sleepiness score is 7 out of 24, fatigue severity score is 47 out of 63.  Nicholas [name] lives with Nicholas [name] girlfriend, Nicholas [name] had some report of witnessed apneas in the past but not lately.  Nicholas [name] does not wake up with a headache and has nocturia about once per average night.  Nicholas [name] goes to bed around 1030 and rise time is between 530 and 545 because Nicholas [name] girlfriend has to get up early.  Nicholas [name] went to the office around 8 AM.  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  Nicholas [name] is a non-smoker and drinks alcohol occasionally in the form of beer, up to 3 times a week, caffeine in the form of tea, typically 1 serving in the morning, occasional soda in the afternoon.  Nicholas [name] has 1 grown daughter and she has 1 daughter in college.  They do have a TV in the bedroom but typically do not watch it at night and Nicholas [name] likes to read before falling asleep and sometimes Nicholas [name] has also read in the middle of the night when Nicholas [name] can sleep but does not do that any longer.  Nicholas [name] had a tonsillectomy at age 75.  Nicholas [name] is not aware of any family history of sleep apnea. I reviewed your office note from 09/11/2019 which was  Nicholas [name] physical. Nicholas [name] has gained some weight in the past few months, in the realm of 10 pounds.  Nicholas [name] exercises on a regular basis, particularly riding Nicholas [name] bike.  Nicholas [name] Past Medical History Is Significant For: Past Medical History:  Diagnosis Date  . Hypertension     Nicholas [name] Past Surgical History Is Significant For: Past Surgical History:  Procedure Laterality Date  . colonscopy    . ESOPHAGOGASTRODUODENOSCOPY (EGD) WITH PROPOFOL N/A 03/02/2017   Procedure: ESOPHAGOGASTRODUODENOSCOPY (EGD);  Surgeon: [name], MD;  Location: [facility];  Service: Gastroenterology;  Laterality: N/A;  . EUS N/A 03/21/2017   Procedure: UPPER ENDOSCOPIC ULTRASOUND (EUS) LINEAR;  Surgeon: [name], MD;  Location: [facility];  Service: Endoscopy;  Laterality: N/A;  . INCISION AND DRAINAGE  2015   left thigh  . LAPAROSCOPIC PARTIAL GASTRECTOMY N/A 08/14/2017   Procedure: LAPAROSCOPIC PARTIAL GASTRECTOMY;  Surgeon: Stark Klein, MD;  Location: [place];  Service: General;  Laterality: N/A;  . TONSILLECTOMY      Nicholas [name] Family History Is Significant For: Family History  Problem Relation Age of Onset  . Pancreatic cancer Mother   . Other Father        "old age"    Nicholas [name] Social History Is Significant For: Social History   Socioeconomic History  . Marital status: Divorced    Spouse name: Not on file  . Number of children: 1  . Years of education: college  . Highest education level: Not on file  Occupational History  . Occupation: Armed forces operational officer  Tobacco Use  . Smoking status: Never Smoker  . Smokeless tobacco: Never Used  Vaping Use  . Vaping Use: Never used  Substance and Sexual Activity  . Alcohol use: Yes    Comment: 2-3 beers per week  . Drug use: Never  . Sexual activity: Not on file  Other Topics Concern  . Not on file  Social History Narrative   Lives at with Nicholas [name] family.   1-2 glasses of iced tea.   Right-handed.   Social Determinants of Health   Financial Resource Strain:  Not on file  Food Insecurity: Not on file  Transportation Needs: Not on file  Physical Activity: Not on file  Stress: Not on file  Social Connections: Not on file    Nicholas [name] Allergies Are:  Allergies  Allergen Reactions  . Penicillins     Has patient had a PCN reaction causing immediate rash, facial/tongue/throat swelling, SOB or lightheadedness with hypotension: unknown Has patient had a PCN reaction causing severe rash involving mucus membranes or skin necrosis: Unknown Has patient had a PCN reaction that required hospitalization: Yes Has patient had a PCN reaction occurring within the last 10 years: No If all of the above  answers are "NO", then may proceed with Cephalosporin use. "childhood reaction"  . Lisinopril Cough  :   Nicholas [name] Current Medications Are:  Outpatient Encounter Medications as of 02/01/2021  Medication Sig  . allopurinol (ZYLOPRIM) 100 MG tablet Take 100 mg by mouth daily.  Marland Kitchen losartan-hydrochlorothiazide (HYZAAR) 50-12.5 MG tablet Take 1 tablet by mouth daily.  . pantoprazole (PROTONIX) 40 MG tablet Take 1 tablet (40 mg total) by mouth 2 (two) times daily.  . [DISCONTINUED] lisinopril-hydrochlorothiazide (PRINZIDE,ZESTORETIC) 20-12.5 MG tablet Take 1 tablet by mouth every evening.   No facility-administered encounter medications on file as of 02/01/2021.  :  Review of Systems:  Out of a complete 14 point review of systems, all are reviewed and negative with the exception of these symptoms as listed below: Review of Systems  Neurological:       Here for f/u since starting. CPAP. Reports some nasal congestion since starting machine, but nasal spray and breath strips help. Reports Nicholas [name] is still adjusting to wearing the mask the entire night.    Objective:  Neurological Exam  Physical Exam Physical Examination:   Vitals:   02/01/21 0819  BP: (!) 148/89  Pulse: (!) 49    General Examination: The patient is a very pleasant 65 y.o. male in no acute distress. Nicholas [name]  appears well-developed and well-nourished and well groomed.   HEENT: Normocephalic, atraumatic, pupils are equal, round and reactive to light, extraocular tracking is good without limitation to gaze excursion or nystagmus noted. Hearing is grossly intact. Face is symmetric with normal facial animation. Speech is clear with no dysarthria noted. There is no hypophonia. There is no lip, neck/head, jaw or voice tremor. Neck is supple with full range of passive and active motion. There are no carotid bruits on auscultation. Oropharynx exam reveals: mild mouth dryness, adequate dental hygiene and mild to moderate airway crowding.  Tonsils absent, Mallampati class II, neck circumference of 17-5/8 inches.  Nicholas [name] has a minimal overbite.  Nasal inspection shows narrow nasal passages, deviated septum to the right, mild bleeding from the right nostril, mild congestion noted.   Chest: Clear to auscultation without wheezing, rhonchi or crackles noted.  Heart: S1+S2+0, regular and normal without murmurs, rubs or gallops noted.   Abdomen: Soft, non-tender and non-distended with normal bowel sounds appreciated on auscultation.  Extremities: There is no edema in the distal lower extremities bilaterally.   Skin: Warm and dry without trophic changes noted.   Musculoskeletal: exam reveals no obvious joint deformities, tenderness or joint swelling or erythema.   Neurologically:  Mental status: The patient is awake, alert and oriented in all 4 spheres. Nicholas [name] immediate and remote memory, attention, language skills and fund of knowledge are appropriate. There is no evidence of aphasia, agnosia, apraxia or anomia. Speech is clear with normal prosody and enunciation. Thought process is linear. Mood is normal and affect is normal.  Cranial nerves II - XII are as described above under HEENT exam.  Motor exam: Normal bulk, strength and tone is noted. There is no tremor, fine motor skills and coordination: grossly intact.   Cerebellar testing: No dysmetria or intention tremor. There is no truncal or gait ataxia.  Sensory exam: intact to light touch in the upper and lower extremities.  Gait, station and balance: Nicholas [name] stands easily. No veering to one side is noted. No leaning to one side is noted. Posture is age-appropriate and stance is narrow based. Gait shows normal stride length and normal pace. No problems turning are noted.  Assessment and Plan:   In summary, Nicholas [name] is a very pleasant 65 year old male with an underlying medical history of hypertension, gout, reflux disease, and mild obesity, who presents for follow-up consultation of Nicholas [name] obstructive sleep apnea which was deemed to be in the severe range by home sleep testing on 08/24/2020 (AHI of 43.7/h, O2 nadir 83%).  Nicholas [name] has established treatment with an AutoPap machine for the past approximately 6 weeks.  While Nicholas [name] is using Nicholas [name] machine nightly, Nicholas [name] is not keeping it on.  Nicholas [name] is struggling with nasal congestion and allergy symptoms.  We had a extended discussion today regarding Nicholas [name] sleep apnea and the need for treatment, positive airway pressure treatment being the most quickest and effective treatment.  Nicholas [name] is encouraged to seek evaluation through ENT for Nicholas [name] septum deviation, epistaxis, nasal congestion and allergy symptoms.  In the meantime, Nicholas [name] is advised to stop using Afrin due to rebound swelling.  Nicholas [name] is advised to use an over-the-counter allergy medicine such as Zyrtec or generic and allergy nasal spray, Nicholas [name] is encouraged to talk to Nicholas [name] pharmacist for guidance as well.  Furthermore, we fitted him with a full facemask of the same brand, ResMed F 30i and provided him with a sample to try.  If Nicholas [name] likes this mask, we can certainly maintain these through Nicholas [name] DME company.  Nicholas [name] is very motivated to continue with treatment and work on Nicholas [name] compliance.  We will plan a follow-up in this clinic in about 3 months, Nicholas [name] is advised to call our office in about 4 to 5 weeks so we can  review another compliance download remotely.  I answered all Nicholas [name] questions today and Nicholas [name] was in agreement with the plan.  I spent 40 minutes in total face-to-face time and in reviewing records during pre-charting, more than 50% of which was spent in counseling and coordination of care, reviewing test results, reviewing medications and treatment regimen and/or in discussing or reviewing the diagnosis of OSA, the prognosis and treatment options. Pertinent laboratory and imaging test results that were available during this visit with the patient were reviewed by me and considered in my medical decision making (see chart for details).

## 2021-02-01 NOTE — Telephone Encounter (Signed)
Pt was seen today by Dr. Rexene Alberts. He is experiencing nasal dryness, stuffiness and issues with wearing CPAP longer than 4hrs a night. Dr. Rexene Alberts has requested a mask refit with patient with a ffm. Pt was fitted with a ResMed F30 Medium ffm. Pt was shown and educated on how to adjust mask for any leaks. Pt was told to call DME to let them know of the mask change.

## 2021-02-01 NOTE — Patient Instructions (Signed)
It was nice to see you again today.  You are compliant when it comes to using your AutoPap every night but you are currently not able to keep it on all night.  Please continue to work on your compliance, we can review your data in about 4 to 5 weeks, please call our office or email [place] through [place] as a reminder.  Please continue using your autoPAP regularly. While your insurance requires that you use PAP at least 4 hours each night on 70% of the nights, I recommend, that you not skip any nights and use it throughout the night if you can. Getting used to PAP and staying with the treatment long term does take time and patience and discipline. Untreated obstructive sleep apnea when it is moderate to severe can have an adverse impact on cardiovascular health and raise her risk for heart disease, arrhythmias, hypertension, congestive heart failure, stroke and diabetes. Untreated obstructive sleep apnea causes sleep disruption, nonrestorative sleep, and sleep deprivation. This can have an impact on your day to day functioning and cause daytime sleepiness and impairment of cognitive function, memory loss, mood disturbance, and problems focussing. Using PAP regularly can improve these symptoms.  We will plan to send you home with a fullface mask sample, please try it and see if you like it any better.  For nasal congestion and allergy symptoms, talk to your pharmacist about what options you have.  Please stop using Afrin or generic Afrin as it causes rebound swelling.  You can use generic Zyrtec and a allergy nasal spray such as Flonase or Nasacort generic.  Your pharmacist may guide you as well.  Please follow-up routinely to see one of our nurse practitioners in 3 months, sooner if needed.  Your apnea scores look adequate when you are using your AutoPap machine.  With time, hopefully as you are able to tolerate the machine and the pressure in the mask, you will reap more benefit from it.  I made a  referral to ENT for you to get evaluated for nasal congestion, and her septal deviation and occasional nosebleeds.  You can try over-the-counter Ayr nose gel to moisturize.  Please do not use any Vaseline especially close to bedtime around your nose or in your nostrils.

## 2021-02-18 DIAGNOSIS — G4733 Obstructive sleep apnea (adult) (pediatric): Secondary | ICD-10-CM | POA: Diagnosis not present

## 2021-03-07 ENCOUNTER — Ambulatory Visit (INDEPENDENT_AMBULATORY_CARE_PROVIDER_SITE_OTHER): Payer: BC Managed Care – PPO | Admitting: Otolaryngology

## 2021-03-07 ENCOUNTER — Encounter (INDEPENDENT_AMBULATORY_CARE_PROVIDER_SITE_OTHER): Payer: Self-pay | Admitting: Otolaryngology

## 2021-03-07 ENCOUNTER — Other Ambulatory Visit: Payer: Self-pay

## 2021-03-07 VITALS — Temp 97.3°F

## 2021-03-07 DIAGNOSIS — J342 Deviated nasal septum: Secondary | ICD-10-CM | POA: Diagnosis not present

## 2021-03-07 DIAGNOSIS — J343 Hypertrophy of nasal turbinates: Secondary | ICD-10-CM

## 2021-03-07 DIAGNOSIS — G4733 Obstructive sleep apnea (adult) (pediatric): Secondary | ICD-10-CM | POA: Diagnosis not present

## 2021-03-07 NOTE — Progress Notes (Signed)
HPI: Nicholas [name] is a 65 y.o. male who presents is referred by Dr. Rexene Alberts for evaluation of deviated septum and chronic nasal congestion.  Patient has recently been diagnosed with obstructive sleep apnea and just started use of CPAP 2 months ago.  He has difficulty using the CPAP because of nasal obstruction and does not tend to wear throughout the night.  He complains of chronic nasal obstruction which is worse at night. Is otherwise healthy but does take medication for high blood pressure. No history of coronary disease.Marland Kitchen He has used Flonase with little benefit.  He is also used Breathe Right strips which seem to help his breathing.  Past Medical History:  Diagnosis Date  . Hypertension    Past Surgical History:  Procedure Laterality Date  . colonscopy    . ESOPHAGOGASTRODUODENOSCOPY (EGD) WITH PROPOFOL N/A 03/02/2017   Procedure: ESOPHAGOGASTRODUODENOSCOPY (EGD);  Surgeon: [name], MD;  Location: [facility];  Service: Gastroenterology;  Laterality: N/A;  . EUS N/A 03/21/2017   Procedure: UPPER ENDOSCOPIC ULTRASOUND (EUS) LINEAR;  Surgeon: [name], MD;  Location: [facility];  Service: Endoscopy;  Laterality: N/A;  . INCISION AND DRAINAGE  2015   left thigh  . LAPAROSCOPIC PARTIAL GASTRECTOMY N/A 08/14/2017   Procedure: LAPAROSCOPIC PARTIAL GASTRECTOMY;  Surgeon: Stark Klein, MD;  Location: [place];  Service: General;  Laterality: N/A;  . TONSILLECTOMY     Social History   Socioeconomic History  . Marital status: Divorced    Spouse name: Not on file  . Number of children: 1  . Years of education: college  . Highest education level: Not on file  Occupational History  . Occupation: Armed forces operational officer  Tobacco Use  . Smoking status: Never Smoker  . Smokeless tobacco: Never Used  Vaping Use  . Vaping Use: Never used  Substance and Sexual Activity  . Alcohol use: Yes    Comment: 2-3 beers per week  . Drug use: Never  . Sexual activity: Not on file  Other  Topics Concern  . Not on file  Social History Narrative   Lives at with his family.   1-2 glasses of iced tea.   Right-handed.   Social Determinants of Health   Financial Resource Strain: Not on file  Food Insecurity: Not on file  Transportation Needs: Not on file  Physical Activity: Not on file  Stress: Not on file  Social Connections: Not on file   Family History  Problem Relation Age of Onset  . Pancreatic cancer Mother   . Other Father        "old age"   Allergies  Allergen Reactions  . Penicillins     Has patient had a PCN reaction causing immediate rash, facial/tongue/throat swelling, SOB or lightheadedness with hypotension: unknown Has patient had a PCN reaction causing severe rash involving mucus membranes or skin necrosis: Unknown Has patient had a PCN reaction that required hospitalization: Yes Has patient had a PCN reaction occurring within the last 10 years: No If all of the above answers are "NO", then may proceed with Cephalosporin use. "childhood reaction"  . Lisinopril Cough   Prior to Admission medications   Medication Sig Start Date End Date Taking? Authorizing Provider  allopurinol (ZYLOPRIM) 100 MG tablet Take 100 mg by mouth daily. 07/12/20   [provider]  losartan-hydrochlorothiazide (HYZAAR) 50-12.5 MG tablet Take 1 tablet by mouth daily. 12/15/20   [provider]  pantoprazole (PROTONIX) 40 MG tablet Take 1 tablet (40 mg total)  by mouth 2 (two) times daily. 03/02/17   Theodis Blaze, MD     Positive ROS: Otherwise negative  All other systems have been reviewed and were otherwise negative with the exception of those mentioned in the HPI and as above.  Physical Exam: Constitutional: Alert, well-appearing, no acute distress Ears: External ears without lesions or tenderness. Ear canals are clear bilaterally with intact, clear TMs.  Nasal: External nose without lesions. Septum is significantly deviated to the right with a very  narrowed right nasal passageway compared to the left.  He has slight nasal valve collapse in addition to this.  Intranasally he has compensatory turbinate hypertrophy on the left side.  There are no polyps.  No intranasal masses noted.  The middle meatus regions are clear bilaterally.. Oral: Lips and gums without lesions. Tongue and palate mucosa without lesions. Posterior oropharynx clear. Neck: No palpable adenopathy or masses Cardiac exam: Regular rate and rhythm without murmur. Respiratory: Breathing comfortably  Skin: No facial/neck lesions or rash noted.  Procedures  Assessment: Severe obstructive sleep apnea Nasal obstruction secondary to septal deviation, turbinate hypertrophy and nasal valve collapse.  Plan: I discussed with the patient that surgery would probably be his best option however initially would try medical therapy consisting of regular use of nasal steroid spray Flonase at night.  Also gave him some information on air max nasal device which helps stent the nose open intranasally. Also reviewed with him concerning use of saline rinses. If he continues to have chronic nasal symptoms he would benefit from surgery which would include septoplasty, turbinate reductions and placement of nasal vestibuloplasty.  Reviewed the surgery with him and that he would have to stay in the hospital overnight since he has obstructive sleep apnea and surgery would require overnight nasal packing.   Radene Journey, MD   CC:

## 2021-03-16 ENCOUNTER — Telehealth (INDEPENDENT_AMBULATORY_CARE_PROVIDER_SITE_OTHER): Payer: Self-pay

## 2021-03-16 NOTE — Telephone Encounter (Signed)
Nicholas [name] insurance card was at front desk. He was seen on 03/08/21. I called to let him know and he asked for it to be mailed to him. I did confirm address and mailed it.

## 2021-05-03 ENCOUNTER — Encounter: Payer: Self-pay | Admitting: Family Medicine

## 2021-05-03 ENCOUNTER — Ambulatory Visit: Payer: BC Managed Care – PPO | Admitting: Family Medicine

## 2021-05-03 VITALS — BP 126/76 | HR 68 | Ht 72.0 in | Wt 248.0 lb

## 2021-05-03 DIAGNOSIS — G4733 Obstructive sleep apnea (adult) (pediatric): Secondary | ICD-10-CM | POA: Diagnosis not present

## 2021-05-03 DIAGNOSIS — Z9989 Dependence on other enabling machines and devices: Secondary | ICD-10-CM | POA: Diagnosis not present

## 2021-05-03 DIAGNOSIS — Z789 Other specified health status: Secondary | ICD-10-CM | POA: Diagnosis not present

## 2021-05-03 NOTE — Patient Instructions (Addendum)
Please continue working on using your CPAP regularly. While your insurance requires that you use CPAP at least 4 hours each night on 70% of the nights, I recommend, that you not skip any nights and use it throughout the night if you can. Getting used to CPAP and staying with the treatment long term does take time and patience and discipline. Untreated obstructive sleep apnea when it is moderate to severe can have an adverse impact on cardiovascular health and raise her risk for heart disease, arrhythmias, hypertension, congestive heart failure, stroke and diabetes. Untreated obstructive sleep apnea causes sleep disruption, nonrestorative sleep, and sleep deprivation. This can have an impact on your day to day functioning and cause daytime sleepiness and impairment of cognitive function, memory loss, mood disturbance, and problems focussing. Using CPAP regularly can improve these symptoms.   Discuss options with your family. I feel that correction of deviated septum may be beneficial in being able to tolerate CPAP. Continue melatonin and valerian root if that helps. We can consider trazodone as a sleep aid if you feel this would be helpful.   Follow up with me in 3-4 months

## 2021-05-03 NOTE — Progress Notes (Addendum)
PATIENT: Nicholas [name] DOB: 1956/08/16  REASON FOR VISIT: follow up HISTORY FROM: patient  Chief Complaint  Patient presents with   Follow-up    Pt alone, new room. F/u today. Pt was started on machine early march and was unable to tolerate. Set up through [facility]. He has a deviated septum was rec surgery but unsure how he plans to move forward. Wanted to talk about alternative options     HISTORY OF PRESENT ILLNESS: 05/03/21 ALL:  Nicholas [name] is a 65 y.o. male here today for follow up for severe OSA on CPAP.  He was fitted with ResMed F 30i FFM at last visit with Dr Rexene Alberts. He used this for a couple of days and did not like it. He returned to nasal pillow for a few weeks but has not used CPAP in the past couple of weeks. He continues to have difficulty with nasal swelling and allergy symptoms. He has traveled more this month and not taken it with him. He was evaluated by ENT and told he needed surgery. He is considering this option. His insurance would not pay for CPAP so he has purchased it himself. He does have a desire to continue trying to use CPAP.     HISTORY: (copied from Dr Guadelupe Sabin previous note)  Mr. Nicholas [name] is a 65 year old right-handed gentleman with an underlying medical history of hypertension, gout, reflux disease, and mild obesity, who presents for follow-up on completion of his sleep disturbance, after interim home sleep testing.  The patient is unaccompanied today.  I first met him at the request of his primary care physician on 08/04/2020, at which time he reported snoring, nonrestorative sleep, difficulty maintaining sleep.  He was advised to proceed with a sleep study.  He had a home sleep test on 08/24/2020 which indicated severe obstructive sleep apnea with an AHI of 43.7/h, O2 nadir 83%.  He was advised to start AutoPap therapy.  Set up date was 12/19/2020.   Today, 02/01/2021: I reviewed his AutoPap compliance data from 01/01/2021 through 01/30/2021, which is a total of 30  days, during which time he used his machine every night with percent use days greater than 4 hours at 40% only, indicating suboptimal compliance, average usage of 3 hours and 35 minutes, residual AHI borderline at 4.8/h, average pressure for the 95th percentile at 9.1 cm with a range of 7 to 14 cm, leak acceptable with a 95th percentile at 15.3 L/min.  In the past 43 days, he has been 100% compliant with regards to using his machine back percent use days greater than 4 hours was 37% only, average usage of 3 hours and 35 minutes.  He reports difficulty tolerating the pressure as well as the nasal mask.  He is struggling with nasal congestion which becomes worse as the night progresses.  He has been using the nasal spray, likely generic Afrin, which helps in the beginning but then he has to use it in the middle of the night.  Sometimes he just pulls the mask off and tries to sleep without it.  He is willing to continue to use it.  He is motivated to get treated for his sleep apnea and also agreeable to seeking evaluation with ENT.  He has noticed allergy symptoms including runny nose and has not tried any over-the-counter allergy medicine.  He does use nasal Breathe Right strips to help with the nasal congestion which is marginally helpful.  He is using a [employer]  nasal mask.   REVIEW OF SYSTEMS: Out of a complete 14 system review of symptoms, the patient complains only of the following symptoms, and all other reviewed systems are negative.  ESS: FSS:  ALLERGIES: Allergies  Allergen Reactions   Penicillins     Has patient had a PCN reaction causing immediate rash, facial/tongue/throat swelling, SOB or lightheadedness with hypotension: unknown Has patient had a PCN reaction causing severe rash involving mucus membranes or skin necrosis: Unknown Has patient had a PCN reaction that required hospitalization: Yes Has patient had a PCN reaction occurring within the last 10 years: No If all of the above  answers are "NO", then may proceed with Cephalosporin use. "childhood reaction"   Lisinopril Cough    HOME MEDICATIONS: Outpatient Medications Prior to Visit  Medication Sig Dispense Refill   allopurinol (ZYLOPRIM) 100 MG tablet Take 100 mg by mouth daily.     losartan-hydrochlorothiazide (HYZAAR) 50-12.5 MG tablet Take 1 tablet by mouth daily.     pantoprazole (PROTONIX) 40 MG tablet Take 1 tablet (40 mg total) by mouth 2 (two) times daily. 60 tablet 1   No facility-administered medications prior to visit.    PAST MEDICAL HISTORY: Past Medical History:  Diagnosis Date   Hypertension     PAST SURGICAL HISTORY: Past Surgical History:  Procedure Laterality Date   colonscopy     ESOPHAGOGASTRODUODENOSCOPY (EGD) WITH PROPOFOL N/A 03/02/2017   Procedure: ESOPHAGOGASTRODUODENOSCOPY (EGD);  Surgeon: [name], MD;  Location: [facility];  Service: Gastroenterology;  Laterality: N/A;   EUS N/A 03/21/2017   Procedure: UPPER ENDOSCOPIC ULTRASOUND (EUS) LINEAR;  Surgeon: [name], MD;  Location: [facility];  Service: Endoscopy;  Laterality: N/A;   INCISION AND DRAINAGE  2015   left thigh   LAPAROSCOPIC PARTIAL GASTRECTOMY N/A 08/14/2017   Procedure: LAPAROSCOPIC PARTIAL GASTRECTOMY;  Surgeon: Stark Klein, MD;  Location: [place];  Service: General;  Laterality: N/A;   TONSILLECTOMY      FAMILY HISTORY: Family History  Problem Relation Age of Onset   Pancreatic cancer Mother    Other Father        "old age"    SOCIAL HISTORY: Social History   Socioeconomic History   Marital status: Divorced    Spouse name: Not on file   Number of children: 1   Years of education: college   Highest education level: Not on file  Occupational History   Occupation: Armed forces operational officer  Tobacco Use   Smoking status: Never   Smokeless tobacco: Never  Vaping Use   Vaping Use: Never used  Substance and Sexual Activity   Alcohol use: Yes    Comment: 2-3 beers per week   Drug  use: Never   Sexual activity: Not on file  Other Topics Concern   Not on file  Social History Narrative   Lives at with his family.   1-2 glasses of iced tea.   Right-handed.   Social Determinants of Health   Financial Resource Strain: Not on file  Food Insecurity: Not on file  Transportation Needs: Not on file  Physical Activity: Not on file  Stress: Not on file  Social Connections: Not on file  Intimate Partner Violence: Not on file     PHYSICAL EXAM  Vitals:   05/03/21 0823  BP: 126/76  Pulse: 68  Weight: 248 lb (112.5 kg)  Height: 6' (1.829 m)   Body mass index is 33.63 kg/m.  Generalized: Well developed, in no acute distress  Cardiology: normal  rate and rhythm, no murmur noted Respiratory: clear to auscultation bilaterally  Neurological examination  Mentation: Alert oriented to time, place, history taking. Follows all commands speech and language fluent Cranial nerve II-XII: Pupils were equal round reactive to light. Extraocular movements were full, visual field were full  Motor: The motor testing reveals 5 over 5 strength of all 4 extremities. Good symmetric motor tone is noted throughout.  Gait and station: Gait is normal.    DIAGNOSTIC DATA (LABS, IMAGING, TESTING) - I reviewed patient records, labs, notes, testing and imaging myself where available.  No flowsheet data found.   Lab Results  Component Value Date   WBC 8.6 08/15/2017   HGB 14.6 08/15/2017   HCT 43.2 08/15/2017   MCV 96.9 08/15/2017   PLT 181 08/15/2017      Component Value Date/Time   NA 135 08/15/2017 0517   K 4.1 08/15/2017 0517   CL 106 08/15/2017 0517   CO2 24 08/15/2017 0517   GLUCOSE 165 (H) 08/15/2017 0517   BUN 11 08/15/2017 0517   CREATININE 1.15 08/15/2017 0517   CREATININE 1.02 05/10/2014 1649   CALCIUM 8.3 (L) 08/15/2017 0517   PROT 6.9 02/28/2017 0629   ALBUMIN 3.6 02/28/2017 0629   AST 33 02/28/2017 0629   ALT 36 02/28/2017 0629   ALKPHOS 71 02/28/2017 0629    BILITOT 0.9 02/28/2017 0629   GFRNONAA >60 08/15/2017 0517   GFRAA >60 08/15/2017 0517   No results found for: CHOL, HDL, LDLCALC, LDLDIRECT, TRIG, CHOLHDL No results found for: HGBA1C No results found for: VITAMINB12 No results found for: TSH   ASSESSMENT AND PLAN 65 y.o. year old male  has a past medical history of Hypertension. here with     ICD-10-CM   1. OSA on CPAP  G47.33    Z99.89     2. Difficulty with CPAP nasal mask use  Z78.9         LADARRIAN ASENCIO continues to have difficulty with allergy symptoms and nasal swelling that is complicating his tolerability of CPAP. Compliance report reveals fairly consistent daily usage but sup optimal four hour usage. We have had a lengthy discussion of options to manage OSA including Inspire and oral appliance. He was advised that based on his sleep study, CPAP would be the gold standard in treatment. He does wish to continue working on compliance. He was encouraged to continue using CPAP nightly and for greater than 4 hours each night. Risks of untreated sleep apnea review and education materials provided. Healthy lifestyle habits encouraged. He will follow up in 3-4 months, sooner if needed. He verbalizes understanding and agreement with this plan.    No orders of the defined types were placed in this encounter.    No orders of the defined types were placed in this encounter.     Debbora Presto, FNP-C 05/03/2021, 8:31 AM [facility] [address] [place], [place] [address] [phone]  I reviewed the above note and documentation by the Nurse Practitioner and agree with the history, exam, assessment and plan as outlined above. I was available for consultation. Star Age, MD, PhD [facility])

## 2021-08-14 ENCOUNTER — Ambulatory Visit: Payer: BC Managed Care – PPO | Admitting: Family Medicine

## 2021-10-11 ENCOUNTER — Encounter: Payer: Self-pay | Admitting: Neurology

## 2021-10-11 ENCOUNTER — Ambulatory Visit: Payer: BC Managed Care – PPO | Admitting: Neurology

## 2021-10-11 VITALS — BP 140/82 | HR 64 | Ht 72.0 in | Wt 260.0 lb

## 2021-10-11 DIAGNOSIS — Z9989 Dependence on other enabling machines and devices: Secondary | ICD-10-CM | POA: Diagnosis not present

## 2021-10-11 DIAGNOSIS — G4733 Obstructive sleep apnea (adult) (pediatric): Secondary | ICD-10-CM

## 2021-10-11 DIAGNOSIS — J309 Allergic rhinitis, unspecified: Secondary | ICD-10-CM

## 2021-10-11 NOTE — Progress Notes (Signed)
Upper pressure limit reduced from 14 to 12 on Resmed per v.o. Dr Rexene Alberts. DME company notified of change via fax [phone]. Received a receipt of confirmation.

## 2021-10-11 NOTE — Progress Notes (Signed)
Subjective:    Patient ID: Nicholas [name] is a 66 y.o. male.  HPI    Interim history:   Nicholas [name] is a 66 year old right-handed gentleman with an underlying medical history of hypertension, gout, reflux disease, and mild obesity, who presents for follow-up on consultation of his sleep apnea, on autoPAP therapy. The patient is unaccompanied today.  I last saw him on 02/01/2021, at which time he was struggling with his AutoPap.  He had nasal congestion and allergy symptoms.  I made a referral to ENT for evaluation of septal deviation, nosebleeds and allergy symptoms.  He was advised to stop using Afrin and start allergy medicine over-the-counter.  He saw Nicholas Presto, NP in the interim on 05/03/2021, at which time he was not fully compliant with his AutoPap.  He had evaluation through ENT and was advised to consider surgery.  I reviewed the office note from 03/07/2021 when he saw Nicholas [name].  He was given information on air max nasal device and was advised to start Flonase.  Surgery in the form of septoplasty, turbinate reductions and placement of nasal vestibuloplasty was discussed.    Today, 10/11/2021: I reviewed his AutoPap compliance data from 09/10/2021 through 10/09/2021, which is a total of 30 days, during which time he used his machine every night with percent use days greater than 4 hours at 83%, indicating very good compliance with an average usage of 5 hours and 21 minutes, residual AHI at goal at 1.1/h, average pressure for the 95th percentile at 9.2 cm with a range of 7 to 14 cm, leak on the higher side with a 95th percentile at 27.3 L/min.  He reports doing well, he has eventually adjusted to treatment.  His insurance dropped coverage unfortunately and he has purchased his machine out-of-pocket.  He is For the mask.  He has been using a nasal mask.  Flonase has been a Higher education careers adviser for him.  He also takes melatonin and valerian at night.  He has a physical pending for this month.   The patient's  allergies, current medications, family history, past medical history, past social history, past surgical history and problem list were reviewed and updated as appropriate.    Previously:   I first met him at the request of his primary care physician on 08/04/2020, at which time he reported snoring, nonrestorative sleep, difficulty maintaining sleep.  He was advised to proceed with a sleep study.  He had a home sleep test on 08/24/2020 which indicated severe obstructive sleep apnea with an AHI of 43.7/h, O2 nadir 83%.  He was advised to start AutoPap therapy.  Set up date was 12/19/2020.  I reviewed his AutoPap compliance data from 01/01/2021 through 01/30/2021, which is a total of 30 days, during which time he used his machine every night with percent use days greater than 4 hours at 40% only, indicating suboptimal compliance, average usage of 3 hours and 35 minutes, residual AHI borderline at 4.8/h, average pressure for the 95th percentile at 9.1 cm with a range of 7 to 14 cm, leak acceptable with a 95th percentile at 15.3 L/min.  In the past 43 days, he has been 100% compliant with regards to using his machine back percent use days greater than 4 hours was 37% only, average usage of 3 hours and 35 minutes.    08/04/20: (He) reports snoring and nonrestorative sleep, sleep disruption, particularly difficulty maintaining sleep.  His symptoms have been worse over the past 2 to 3 months.  Some years ago he tried Ambien but did not want to stay on it long-term.  He tries melatonin off and on 10 mg strength.  It helps a little bit.  He does snore and snoring is better when he uses a Breathe Right strip.  His Epworth sleepiness score is 7 out of 24, fatigue severity score is 47 out of 63.  He lives with his girlfriend, he had some report of witnessed apneas in the past but not lately.  He does not wake up with a headache and has nocturia about once per average night.  He goes to bed around 1030 and rise time is  between 530 and 545 because his girlfriend has to get up early.  He went to the office around 8 AM.  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  He is a non-smoker and drinks alcohol occasionally in the form of beer, up to 3 times a week, caffeine in the form of tea, typically 1 serving in the morning, occasional soda in the afternoon.  He has 1 grown daughter and she has 1 daughter in college.  They do have a TV in the bedroom but typically do not watch it at night and he likes to read before falling asleep and sometimes he has also read in the middle of the night when he can sleep but does not do that any longer.  He had a tonsillectomy at age 8.  He is not aware of any family history of sleep apnea. I reviewed your office note from 09/11/2019 which was his physical. He has gained some weight in the past few months, in the realm of 10 pounds.  He exercises on a regular basis, particularly riding his bike.   His Past Medical History Is Significant For: Past Medical History:  Diagnosis Date   Hypertension     His Past Surgical History Is Significant For: Past Surgical History:  Procedure Laterality Date   colonscopy     ESOPHAGOGASTRODUODENOSCOPY (EGD) WITH PROPOFOL N/A 03/02/2017   Procedure: ESOPHAGOGASTRODUODENOSCOPY (EGD);  Surgeon: [name], MD;  Location: [facility];  Service: Gastroenterology;  Laterality: N/A;   EUS N/A 03/21/2017   Procedure: UPPER ENDOSCOPIC ULTRASOUND (EUS) LINEAR;  Surgeon: [name], MD;  Location: [facility];  Service: Endoscopy;  Laterality: N/A;   INCISION AND DRAINAGE  2015   left thigh   LAPAROSCOPIC PARTIAL GASTRECTOMY N/A 08/14/2017   Procedure: LAPAROSCOPIC PARTIAL GASTRECTOMY;  Surgeon: Stark Klein, MD;  Location: [place];  Service: General;  Laterality: N/A;   TONSILLECTOMY      His Family History Is Significant For: Family History  Problem Relation Age of Onset   Pancreatic cancer Mother    Other Father         "old age"    His Social History Is Significant For: Social History   Socioeconomic History   Marital status: Divorced    Spouse name: Not on file   Number of children: 1   Years of education: college   Highest education level: Not on file  Occupational History   Occupation: Armed forces operational officer  Tobacco Use   Smoking status: Never   Smokeless tobacco: Never  Vaping Use   Vaping Use: Never used  Substance and Sexual Activity   Alcohol use: Yes    Comment: 2-3 beers per week   Drug use: Never   Sexual activity: Not on file  Other Topics Concern   Not on file  Social History  Narrative   Lives at with his family.   1-2 glasses of iced tea.   Right-handed.   Social Determinants of Health   Financial Resource Strain: Not on file  Food Insecurity: Not on file  Transportation Needs: Not on file  Physical Activity: Not on file  Stress: Not on file  Social Connections: Not on file    His Allergies Are:  Allergies  Allergen Reactions   Penicillins     Has patient had a PCN reaction causing immediate rash, facial/tongue/throat swelling, SOB or lightheadedness with hypotension: unknown Has patient had a PCN reaction causing severe rash involving mucus membranes or skin necrosis: Unknown Has patient had a PCN reaction that required hospitalization: Yes Has patient had a PCN reaction occurring within the last 10 years: No If all of the above answers are "NO", then may proceed with Cephalosporin use. "childhood reaction"   Lisinopril Cough  :   His Current Medications Are:  Outpatient Encounter Medications as of 10/11/2021  Medication Sig   allopurinol (ZYLOPRIM) 100 MG tablet Take 100 mg by mouth daily.   losartan-hydrochlorothiazide (HYZAAR) 50-12.5 MG tablet Take 1 tablet by mouth daily.   pantoprazole (PROTONIX) 40 MG tablet Take 1 tablet (40 mg total) by mouth 2 (two) times daily.   No facility-administered encounter medications on file as of 10/11/2021.  :  Review of  Systems:  Out of a complete 14 point review of systems, all are reviewed and negative with the exception of these symptoms as listed below:  Review of Systems  Neurological:        Rm 9 alone.  Doing well with cpap. Needs new mask,  (DME home [facility]).   Objective:  Neurological Exam  Physical Exam Physical Examination:   Vitals:   10/11/21 0736  BP: 140/82  Pulse: 64    General Examination: The patient is a very pleasant 66 y.o. male in no acute distress. He appears well-developed and well-nourished and well groomed.   HEENT: Normocephalic, atraumatic, pupils are equal, round and reactive to light, extraocular tracking is good without limitation to gaze excursion or nystagmus noted. Hearing is grossly intact. Face is symmetric with normal facial animation. Speech is clear with no dysarthria noted. There is no hypophonia. There is no lip, neck/head, jaw or voice tremor. Neck is supple with full range of passive and active motion. There are no carotid bruits on auscultation. Oropharynx exam reveals: No significant mouth dryness, adequate dental hygiene and mild to moderate airway crowding.    Chest: Clear to auscultation without wheezing, rhonchi or crackles noted.   Heart: S1+S2+0, regular and normal without murmurs, rubs or gallops noted.    Abdomen: Soft, non-tender and non-distended.   Extremities: There is no edema in the distal lower extremities bilaterally.    Skin: Warm and dry without trophic changes noted.    Musculoskeletal: exam reveals no obvious joint deformities.    Neurologically:  Mental status: The patient is awake, alert and oriented in all 4 spheres. His immediate and remote memory, attention, language skills and fund of knowledge are appropriate. There is no evidence of aphasia, agnosia, apraxia or anomia. Speech is clear with normal prosody and enunciation. Thought process is linear. Mood is normal and affect is normal.  Cranial nerves II - XII are as  described above under HEENT exam.  Motor exam: Normal bulk, strength and tone is noted. There is no tremor, fine motor skills and coordination: grossly intact.  Cerebellar testing: No dysmetria or intention  tremor. There is no truncal or gait ataxia.  Sensory exam: intact to light touch in the upper and lower extremities.  Gait, station and balance: He stands easily. No veering to one side is noted. No leaning to one side is noted. Posture is age-appropriate and stance is narrow based. Gait shows normal stride length and normal pace. No problems turning are noted.    Assessment and Plan:    In summary, THEODOROS STJAMES is a very pleasant 66 year old male with an underlying medical history of hypertension, gout, reflux disease, and mild obesity, who presents for follow-up consultation of his obstructive sleep apnea which was deemed to be in the severe range by home sleep testing on 08/24/2020 (AHI of 43.7/h, O2 nadir 83%).  He has established treatment with AutoPap therapy and is now compliant with treatment.  He struggled in the beginning due to allergy symptoms and nasal congestion.  He has been using Flonase consistently which has really helped.  He no longer takes Afrin.  He had consultation with ENT and may eventually need nasal surgery but has not decided yet.  He is motivated to continue with treatment and has benefited from it.  He feels better.  He is commended for his treatment adherence.  I have placed an order for new supplies through his DME company.  In addition, I have requested a maximum pressure reduction to 12 cm from 14 cm currently.  He is advised to follow-up routinely to see one of our nurse practitioners in 1 year at this point.  I answered all his questions today and he was in agreement.  I spent 30 minutes in total face-to-face time and in reviewing records during pre-charting, more than 50% of which was spent in counseling and coordination of care, reviewing test results, reviewing  medications and treatment regimen and/or in discussing or reviewing the diagnosis of OSA, the prognosis and treatment options. Pertinent laboratory and imaging test results that were available during this visit with the patient were reviewed by me and considered in my medical decision making (see chart for details).

## 2021-10-11 NOTE — Patient Instructions (Signed)
Please continue using your autoPAP regularly. While your insurance requires that you use PAP at least 4 hours each night on 70% of the nights, I recommend, that you not skip any nights and use it throughout the night if you can. Getting used to PAP and staying with the treatment long term does take time and patience and discipline. Untreated obstructive sleep apnea when it is moderate to severe can have an adverse impact on cardiovascular health and raise her risk for heart disease, arrhythmias, hypertension, congestive heart failure, stroke and diabetes. Untreated obstructive sleep apnea causes sleep disruption, nonrestorative sleep, and sleep deprivation. This can have an impact on your day to day functioning and cause daytime sleepiness and impairment of cognitive function, memory loss, mood disturbance, and problems focussing. Using PAP regularly can improve these symptoms.  You have done a great job adjusting to AutoPap therapy and you are very compliant with your treatment at this time.  Keep up the good work.  Please follow-up routinely to see Nicholas Presto, NP in 1 year.  I have placed an order for your supplies and for a pressure reduction, we will send this order to [facility].  If you do not have contact with them within a week, call us back.

## 2021-10-16 DIAGNOSIS — Z125 Encounter for screening for malignant neoplasm of prostate: Secondary | ICD-10-CM | POA: Diagnosis not present

## 2021-10-16 DIAGNOSIS — I10 Essential (primary) hypertension: Secondary | ICD-10-CM | POA: Diagnosis not present

## 2021-10-16 DIAGNOSIS — E559 Vitamin D deficiency, unspecified: Secondary | ICD-10-CM | POA: Diagnosis not present

## 2021-10-16 DIAGNOSIS — M109 Gout, unspecified: Secondary | ICD-10-CM | POA: Diagnosis not present

## 2021-10-23 DIAGNOSIS — Z23 Encounter for immunization: Secondary | ICD-10-CM | POA: Diagnosis not present

## 2021-10-23 DIAGNOSIS — Z0000 Encounter for general adult medical examination without abnormal findings: Secondary | ICD-10-CM | POA: Diagnosis not present

## 2021-10-23 DIAGNOSIS — I10 Essential (primary) hypertension: Secondary | ICD-10-CM | POA: Diagnosis not present

## 2021-10-23 DIAGNOSIS — Z1331 Encounter for screening for depression: Secondary | ICD-10-CM | POA: Diagnosis not present

## 2021-10-23 DIAGNOSIS — Z1339 Encounter for screening examination for other mental health and behavioral disorders: Secondary | ICD-10-CM | POA: Diagnosis not present

## 2021-10-23 DIAGNOSIS — E669 Obesity, unspecified: Secondary | ICD-10-CM | POA: Diagnosis not present

## 2021-10-23 DIAGNOSIS — M109 Gout, unspecified: Secondary | ICD-10-CM | POA: Diagnosis not present

## 2021-10-24 DIAGNOSIS — E669 Obesity, unspecified: Secondary | ICD-10-CM | POA: Diagnosis not present

## 2021-10-24 DIAGNOSIS — I10 Essential (primary) hypertension: Secondary | ICD-10-CM | POA: Diagnosis not present

## 2021-11-03 DIAGNOSIS — G4733 Obstructive sleep apnea (adult) (pediatric): Secondary | ICD-10-CM | POA: Diagnosis not present

## 2021-11-30 ENCOUNTER — Ambulatory Visit: Payer: BC Managed Care – PPO | Admitting: Family Medicine

## 2022-01-30 DIAGNOSIS — E669 Obesity, unspecified: Secondary | ICD-10-CM | POA: Diagnosis not present

## 2022-02-06 DIAGNOSIS — R109 Unspecified abdominal pain: Secondary | ICD-10-CM | POA: Diagnosis not present

## 2022-02-21 DIAGNOSIS — R051 Acute cough: Secondary | ICD-10-CM | POA: Diagnosis not present

## 2022-02-21 DIAGNOSIS — R0981 Nasal congestion: Secondary | ICD-10-CM | POA: Diagnosis not present

## 2022-02-21 DIAGNOSIS — I10 Essential (primary) hypertension: Secondary | ICD-10-CM | POA: Diagnosis not present

## 2022-05-03 DIAGNOSIS — I10 Essential (primary) hypertension: Secondary | ICD-10-CM | POA: Diagnosis not present

## 2022-08-15 DIAGNOSIS — I10 Essential (primary) hypertension: Secondary | ICD-10-CM | POA: Diagnosis not present

## 2022-08-15 DIAGNOSIS — Z23 Encounter for immunization: Secondary | ICD-10-CM | POA: Diagnosis not present

## 2022-10-15 ENCOUNTER — Ambulatory Visit: Payer: BC Managed Care – PPO | Admitting: Family Medicine

## 2022-10-19 DIAGNOSIS — Z125 Encounter for screening for malignant neoplasm of prostate: Secondary | ICD-10-CM | POA: Diagnosis not present

## 2022-10-19 DIAGNOSIS — E559 Vitamin D deficiency, unspecified: Secondary | ICD-10-CM | POA: Diagnosis not present

## 2022-10-19 DIAGNOSIS — R7302 Impaired glucose tolerance (oral): Secondary | ICD-10-CM | POA: Diagnosis not present

## 2022-10-19 DIAGNOSIS — I10 Essential (primary) hypertension: Secondary | ICD-10-CM | POA: Diagnosis not present

## 2022-10-26 DIAGNOSIS — Z1339 Encounter for screening examination for other mental health and behavioral disorders: Secondary | ICD-10-CM | POA: Diagnosis not present

## 2022-10-26 DIAGNOSIS — Z0000 Encounter for general adult medical examination without abnormal findings: Secondary | ICD-10-CM | POA: Diagnosis not present

## 2022-10-26 DIAGNOSIS — Z1331 Encounter for screening for depression: Secondary | ICD-10-CM | POA: Diagnosis not present

## 2022-10-26 DIAGNOSIS — I10 Essential (primary) hypertension: Secondary | ICD-10-CM | POA: Diagnosis not present

## 2023-07-09 DIAGNOSIS — J342 Deviated nasal septum: Secondary | ICD-10-CM | POA: Diagnosis not present

## 2023-08-05 ENCOUNTER — Institutional Professional Consult (permissible substitution) (INDEPENDENT_AMBULATORY_CARE_PROVIDER_SITE_OTHER): Payer: BC Managed Care – PPO | Admitting: Otolaryngology

## 2023-09-26 ENCOUNTER — Encounter (HOSPITAL_BASED_OUTPATIENT_CLINIC_OR_DEPARTMENT_OTHER): Payer: Self-pay

## 2023-09-26 ENCOUNTER — Other Ambulatory Visit (HOSPITAL_BASED_OUTPATIENT_CLINIC_OR_DEPARTMENT_OTHER): Payer: Self-pay

## 2023-09-26 DIAGNOSIS — I10 Essential (primary) hypertension: Secondary | ICD-10-CM | POA: Diagnosis not present

## 2023-09-26 DIAGNOSIS — G4733 Obstructive sleep apnea (adult) (pediatric): Secondary | ICD-10-CM | POA: Diagnosis not present

## 2023-09-26 DIAGNOSIS — E669 Obesity, unspecified: Secondary | ICD-10-CM | POA: Diagnosis not present

## 2023-09-26 DIAGNOSIS — K219 Gastro-esophageal reflux disease without esophagitis: Secondary | ICD-10-CM | POA: Diagnosis not present

## 2023-09-26 MED ORDER — ZEPBOUND 10 MG/0.5ML ~~LOC~~ SOAJ: 10.0000 mg | SUBCUTANEOUS | 3 refills | Status: AC

## 2023-09-26 MED ORDER — ZEPBOUND 7.5 MG/0.5ML ~~LOC~~ SOAJ
7.5000 mg | SUBCUTANEOUS | 3 refills | Status: AC
  Filled 2023-09-26 – 2023-09-30 (×3): qty 2, 28d supply, fill #0

## 2023-09-27 ENCOUNTER — Other Ambulatory Visit (HOSPITAL_BASED_OUTPATIENT_CLINIC_OR_DEPARTMENT_OTHER): Payer: Self-pay

## 2023-09-29 ENCOUNTER — Other Ambulatory Visit (HOSPITAL_BASED_OUTPATIENT_CLINIC_OR_DEPARTMENT_OTHER): Payer: Self-pay

## 2023-09-30 ENCOUNTER — Other Ambulatory Visit (HOSPITAL_BASED_OUTPATIENT_CLINIC_OR_DEPARTMENT_OTHER): Payer: Self-pay

## 2023-10-25 DIAGNOSIS — M109 Gout, unspecified: Secondary | ICD-10-CM | POA: Diagnosis not present

## 2023-10-25 DIAGNOSIS — E559 Vitamin D deficiency, unspecified: Secondary | ICD-10-CM | POA: Diagnosis not present

## 2023-10-25 DIAGNOSIS — I10 Essential (primary) hypertension: Secondary | ICD-10-CM | POA: Diagnosis not present

## 2023-10-25 DIAGNOSIS — Z125 Encounter for screening for malignant neoplasm of prostate: Secondary | ICD-10-CM | POA: Diagnosis not present

## 2023-11-01 DIAGNOSIS — E669 Obesity, unspecified: Secondary | ICD-10-CM | POA: Diagnosis not present

## 2023-11-01 DIAGNOSIS — R82998 Other abnormal findings in urine: Secondary | ICD-10-CM | POA: Diagnosis not present

## 2023-11-01 DIAGNOSIS — I10 Essential (primary) hypertension: Secondary | ICD-10-CM | POA: Diagnosis not present

## 2023-11-01 DIAGNOSIS — G4733 Obstructive sleep apnea (adult) (pediatric): Secondary | ICD-10-CM | POA: Diagnosis not present

## 2023-11-01 DIAGNOSIS — K219 Gastro-esophageal reflux disease without esophagitis: Secondary | ICD-10-CM | POA: Diagnosis not present

## 2023-11-01 DIAGNOSIS — M109 Gout, unspecified: Secondary | ICD-10-CM | POA: Diagnosis not present

## 2023-11-01 DIAGNOSIS — Z0000 Encounter for general adult medical examination without abnormal findings: Secondary | ICD-10-CM | POA: Diagnosis not present

## 2023-11-01 DIAGNOSIS — E559 Vitamin D deficiency, unspecified: Secondary | ICD-10-CM | POA: Diagnosis not present

## 2023-11-01 DIAGNOSIS — Z23 Encounter for immunization: Secondary | ICD-10-CM | POA: Diagnosis not present

## 2024-05-21 DIAGNOSIS — L237 Allergic contact dermatitis due to plants, except food: Secondary | ICD-10-CM | POA: Diagnosis not present

## 2024-05-21 DIAGNOSIS — L282 Other prurigo: Secondary | ICD-10-CM | POA: Diagnosis not present

## 2024-09-01 DIAGNOSIS — Z23 Encounter for immunization: Secondary | ICD-10-CM | POA: Diagnosis not present
